# Patient Record
Sex: Female | Born: 1960 | Race: White | Hispanic: No | Smoking: Former smoker
Health system: Southern US, Community
[De-identification: ages and names within clinical notes are randomized; demographics above are authoritative.]

## PROBLEM LIST (undated history)

## (undated) DIAGNOSIS — M419 Scoliosis, unspecified: Secondary | ICD-10-CM

## (undated) DIAGNOSIS — M1990 Unspecified osteoarthritis, unspecified site: Secondary | ICD-10-CM

## (undated) DIAGNOSIS — I480 Paroxysmal atrial fibrillation: Secondary | ICD-10-CM

## (undated) DIAGNOSIS — Z9289 Personal history of other medical treatment: Secondary | ICD-10-CM

## (undated) DIAGNOSIS — M8580 Other specified disorders of bone density and structure, unspecified site: Secondary | ICD-10-CM

## (undated) DIAGNOSIS — I493 Ventricular premature depolarization: Secondary | ICD-10-CM

## (undated) DIAGNOSIS — I4719 Other supraventricular tachycardia: Secondary | ICD-10-CM

## (undated) DIAGNOSIS — I4819 Other persistent atrial fibrillation: Secondary | ICD-10-CM

## (undated) DIAGNOSIS — I491 Atrial premature depolarization: Secondary | ICD-10-CM

## (undated) DIAGNOSIS — R011 Cardiac murmur, unspecified: Secondary | ICD-10-CM

## (undated) DIAGNOSIS — I471 Supraventricular tachycardia: Secondary | ICD-10-CM

## (undated) HISTORY — DX: Other supraventricular tachycardia: I47.19

## (undated) HISTORY — DX: Atrial premature depolarization: I49.1

## (undated) HISTORY — DX: Unspecified osteoarthritis, unspecified site: M19.90

## (undated) HISTORY — DX: Personal history of other medical treatment: Z92.89

## (undated) HISTORY — PX: COLONOSCOPY: SHX174

## (undated) HISTORY — DX: Other specified disorders of bone density and structure, unspecified site: M85.80

## (undated) HISTORY — DX: Supraventricular tachycardia: I47.1

## (undated) HISTORY — DX: Paroxysmal atrial fibrillation: I48.0

## (undated) HISTORY — PX: TOE SURGERY: SHX1073

## (undated) HISTORY — DX: Scoliosis, unspecified: M41.9

## (undated) HISTORY — DX: Cardiac murmur, unspecified: R01.1

## (undated) HISTORY — DX: Ventricular premature depolarization: I49.3

## (undated) HISTORY — DX: Other persistent atrial fibrillation: I48.19

---

## 2009-03-04 ENCOUNTER — Ambulatory Visit: Payer: Self-pay | Admitting: Occupational Medicine

## 2009-03-04 ENCOUNTER — Encounter: Admission: RE | Admit: 2009-03-04 | Discharge: 2009-03-04 | Payer: Self-pay | Admitting: Family Medicine

## 2009-03-04 DIAGNOSIS — N200 Calculus of kidney: Secondary | ICD-10-CM

## 2015-02-04 ENCOUNTER — Ambulatory Visit: Payer: Self-pay | Admitting: Internal Medicine

## 2015-03-29 ENCOUNTER — Encounter: Payer: Self-pay | Admitting: *Deleted

## 2015-03-29 ENCOUNTER — Telehealth: Payer: Self-pay | Admitting: *Deleted

## 2015-03-29 NOTE — Telephone Encounter (Signed)
I SPOKE WITH NICOLE AT [facility]. SHE STATED THE PT HAS NEVER BEEN SEEN BY [facility]. SHE DIDN'T SEE ANY CARDIOLOGY NOTES IN THE PAPER CHART.

## 2015-03-29 NOTE — Telephone Encounter (Signed)
I LEFT A VM FOR PT TO CALL BACK WITH PCP INFO AND FAMILY HX .

## 2015-04-04 ENCOUNTER — Encounter: Payer: Self-pay | Admitting: Cardiology

## 2015-04-04 ENCOUNTER — Ambulatory Visit (INDEPENDENT_AMBULATORY_CARE_PROVIDER_SITE_OTHER): Payer: PRIVATE HEALTH INSURANCE | Admitting: Cardiology

## 2015-04-04 VITALS — BP 120/82 | HR 63 | Ht 70.0 in | Wt 180.6 lb

## 2015-04-04 DIAGNOSIS — R002 Palpitations: Secondary | ICD-10-CM | POA: Insufficient documentation

## 2015-04-04 DIAGNOSIS — R011 Cardiac murmur, unspecified: Secondary | ICD-10-CM

## 2015-04-04 DIAGNOSIS — I4510 Unspecified right bundle-branch block: Secondary | ICD-10-CM | POA: Insufficient documentation

## 2015-04-04 DIAGNOSIS — I450 Right fascicular block: Secondary | ICD-10-CM

## 2015-04-04 DIAGNOSIS — M79601 Pain in right arm: Secondary | ICD-10-CM | POA: Diagnosis not present

## 2015-04-04 NOTE — Patient Instructions (Signed)
Medication Instructions:  Your physician recommends that you continue on your current medications as directed. Please refer to the Current Medication list given to you today.   Labwork: None  Testing/Procedures: Your physician has requested that you have an echocardiogram. Echocardiography is a painless test that uses sound waves to create images of your heart. It provides your doctor with information about the size and shape of your heart and how well your heart's chambers and valves are working. This procedure takes approximately one hour. There are no restrictions for this procedure.   Your physician has requested that you have an exercise tolerance test. For further information please visit [email or web address] Please also follow instruction sheet, as given.  Follow-Up: You have been referred to Dr. Gilman Schmidt for hand/arm pain.  Your physician wants you to follow-up in: 1 year with Dr. Mayford Knife. You will receive a reminder letter in the mail two months in advance. If you don't receive a letter, please call our office to schedule the follow-up appointment.   Any Other Special Instructions Will Be Listed Below (If Applicable).

## 2015-04-04 NOTE — Progress Notes (Signed)
Cardiology Office Note   Date:  04/04/2015   ID:  Joann Murray, DOB Aug 15, 1961, MRN [id number]  PCP:  Marcy Panning, DO    Chief Complaint  Patient presents with  . New Evaluation    heart murmur      History of Present Illness: Joann Murray is a 54 y.o. female who presents for evaluation of heart murmur.  She recently went to an Urgent Care and was told that she had a heart murmur and needed to see a Cardiologist.  She apparently was seen by [facility] over 12 years ago bu Dr. Fraser Din and had a stress test that was normal, an echo showed MVP and PVC's.  She denies any chest pain, SOB, DOE, LE edema, dizziness,  or syncope.  She occasionally feels skipped heart beats when lying on her left side.  She has noticed that when she does Cardio her HR speed up very fast and then stays up for a while and feels weird.      Past Medical History  Diagnosis Date  . Murmur   . Osteopenia   . Osteoarthritis   . Scoliosis     Past Surgical History  Procedure Laterality Date  . Toe surgery       Current Outpatient Prescriptions  Medication Sig Dispense Refill  . cholecalciferol (VITAMIN D) 1000 UNITS tablet Take 1,000 Units by mouth daily.    . Multiple Vitamin (MULTIVITAMIN) capsule Take 1 capsule by mouth daily.     No current facility-administered medications for this visit.    Allergies:   Codeine    Social History:  The patient  reports that she has quit smoking. She does not have any smokeless tobacco history on file. She reports that she drinks alcohol. She reports that she does not use illicit drugs.   Family History:  The patient's family history includes Arthritis/Rheumatoid in her mother; Rheumatic fever in her mother; Stroke in her mother.    ROS:  Please see the history of present illness.   Otherwise, review of systems are positive for right forearm, elbow and hand pain.   All other systems are reviewed and negative.    PHYSICAL  EXAM: VS:  BP 120/82 mmHg  Pulse 63  Ht  (1.778 m)  Wt 180 lb 9.6 oz (81.92 kg)  BMI 25.91 kg/m2 , BMI Body mass index is 25.91 kg/(m^2). GEN: Well nourished, well developed, in no acute distress HEENT: normal Neck: no JVD, carotid bruits, or masses Cardiac: RRR; no murmurs, rubs, or gallops,no edema  Respiratory:  clear to auscultation bilaterally, normal work of breathing GI: soft, nontender, nondistended, + BS MS: no deformity or atrophy Skin: warm and dry, no rash Neuro:  Strength and sensation are intact Psych: euthymic mood, full affect   EKG:  EKG is ordered today. The ekg ordered today demonstrates NSR at 63bpm with IRBBB and septal infarct  and T wave inversions in V1 and V2   Recent Labs: No results found for requested labs within last 365 days.    Lipid Panel No results found for: CHOL, TRIG, HDL, CHOLHDL, VLDL, LDLCALC, LDLDIRECT    Wt Readings from Last 3 Encounters:  04/04/15 180 lb 9.6 oz (81.92 kg)  03/04/09 145 lb (65.772 kg)        ASSESSMENT AND PLAN:  1.  Heart murmur - she has a classic midsystolic click at the apex  c/w mitral valve prolapse.   I will check 2D echo to assess further 2.  Abnormal EKG with septal infarct - probable Lead placement - check 2D echo to assess LVF.  Check ETT . 3.  Palpitations - these occur in setting of aerobic exercise and then it takes a while for her heart rate to decrease to normal. - I will assess this at the time of her ETT. 4.  Right had and arm pain after an injury that has not improved.  I will refer her to Dr. Thomas Hoff with [facility].   Current medicines are reviewed at length with the patient today.  The patient does not have concerns regarding medicines.  The following changes have been made:  no change  Labs/ tests ordered today: See above Assessment and Plan No orders of the defined types were placed in this encounter.     Disposition:   FU with me  in 1 year  Signed, Quintella Reichert, MD    04/04/2015 9:39 AM    [facility] [address] [place], [place], [place]  [address] Phone: [phone]; Fax: [phone]

## 2015-06-12 ENCOUNTER — Other Ambulatory Visit: Payer: Self-pay | Admitting: Cardiology

## 2015-06-12 DIAGNOSIS — R002 Palpitations: Secondary | ICD-10-CM

## 2015-06-12 DIAGNOSIS — I4510 Unspecified right bundle-branch block: Secondary | ICD-10-CM

## 2015-06-12 DIAGNOSIS — R9431 Abnormal electrocardiogram [ECG] [EKG]: Secondary | ICD-10-CM

## 2015-06-13 ENCOUNTER — Ambulatory Visit (HOSPITAL_COMMUNITY): Payer: PRIVATE HEALTH INSURANCE | Attending: Cardiology

## 2015-06-13 ENCOUNTER — Other Ambulatory Visit: Payer: Self-pay

## 2015-06-13 ENCOUNTER — Other Ambulatory Visit: Payer: Self-pay | Admitting: Physician Assistant

## 2015-06-13 ENCOUNTER — Encounter: Payer: PRIVATE HEALTH INSURANCE | Admitting: Physician Assistant

## 2015-06-13 ENCOUNTER — Ambulatory Visit (INDEPENDENT_AMBULATORY_CARE_PROVIDER_SITE_OTHER): Payer: PRIVATE HEALTH INSURANCE

## 2015-06-13 DIAGNOSIS — R002 Palpitations: Secondary | ICD-10-CM | POA: Insufficient documentation

## 2015-06-13 DIAGNOSIS — I5189 Other ill-defined heart diseases: Secondary | ICD-10-CM | POA: Insufficient documentation

## 2015-06-13 DIAGNOSIS — Z87891 Personal history of nicotine dependence: Secondary | ICD-10-CM | POA: Insufficient documentation

## 2015-06-13 DIAGNOSIS — I4510 Unspecified right bundle-branch block: Secondary | ICD-10-CM

## 2015-06-13 DIAGNOSIS — R011 Cardiac murmur, unspecified: Secondary | ICD-10-CM | POA: Diagnosis not present

## 2015-06-13 DIAGNOSIS — R9431 Abnormal electrocardiogram [ECG] [EKG]: Secondary | ICD-10-CM

## 2015-06-13 DIAGNOSIS — R9439 Abnormal result of other cardiovascular function study: Secondary | ICD-10-CM

## 2015-06-13 DIAGNOSIS — I450 Right fascicular block: Secondary | ICD-10-CM | POA: Diagnosis not present

## 2015-06-13 LAB — EXERCISE TOLERANCE TEST
CHL CUP MPHR: 165 {beats}/min
CHL CUP RESTING HR STRESS: 68 {beats}/min
CHL RATE OF PERCEIVED EXERTION: 15
CSEPHR: 97 %
Estimated workload: 10.1 METS
Exercise duration (min): 9 min
Peak HR: 160 {beats}/min

## 2015-06-19 ENCOUNTER — Telehealth (HOSPITAL_COMMUNITY): Payer: Self-pay

## 2015-06-19 NOTE — Telephone Encounter (Signed)
Encounter complete. 

## 2015-06-26 ENCOUNTER — Encounter (HOSPITAL_COMMUNITY): Payer: Self-pay | Admitting: *Deleted

## 2015-06-26 ENCOUNTER — Ambulatory Visit (HOSPITAL_COMMUNITY)
Admission: RE | Admit: 2015-06-26 | Discharge: 2015-06-26 | Disposition: A | Discharge status: Home / Self Care | Payer: PRIVATE HEALTH INSURANCE | Source: Ambulatory Visit | Attending: Cardiovascular Disease | Admitting: Cardiovascular Disease

## 2015-06-26 DIAGNOSIS — R42 Dizziness and giddiness: Secondary | ICD-10-CM | POA: Insufficient documentation

## 2015-06-26 DIAGNOSIS — R0602 Shortness of breath: Secondary | ICD-10-CM | POA: Diagnosis not present

## 2015-06-26 DIAGNOSIS — R002 Palpitations: Secondary | ICD-10-CM | POA: Insufficient documentation

## 2015-06-26 DIAGNOSIS — R9439 Abnormal result of other cardiovascular function study: Secondary | ICD-10-CM

## 2015-06-26 DIAGNOSIS — R079 Chest pain, unspecified: Secondary | ICD-10-CM | POA: Diagnosis not present

## 2015-06-26 DIAGNOSIS — R9431 Abnormal electrocardiogram [ECG] [EKG]: Secondary | ICD-10-CM | POA: Diagnosis not present

## 2015-06-26 DIAGNOSIS — F17200 Nicotine dependence, unspecified, uncomplicated: Secondary | ICD-10-CM | POA: Insufficient documentation

## 2015-06-26 DIAGNOSIS — R0609 Other forms of dyspnea: Secondary | ICD-10-CM | POA: Diagnosis not present

## 2015-06-26 LAB — MYOCARDIAL PERFUSION IMAGING
CHL CUP NUCLEAR SRS: 3
CHL CUP NUCLEAR SSS: 8
CSEPED: 5 min
CSEPPHR: 206 {beats}/min
Estimated workload: 7 METS
Exercise duration (sec): 23 s
LV dias vol: 82 mL
LVSYSVOL: 32 mL
MPHR: 165 {beats}/min
Percent HR: 124 %
RPE: 14
Rest HR: 68 {beats}/min
SDS: 5
TID: 1.37

## 2015-06-26 MED ORDER — TECHNETIUM TC 99M SESTAMIBI GENERIC - CARDIOLITE
10.3000 | Freq: Once | INTRAVENOUS | Status: AC | PRN
  Administered 2015-06-26: 10 via INTRAVENOUS

## 2015-06-26 MED ORDER — TECHNETIUM TC 99M SESTAMIBI GENERIC - CARDIOLITE
31.6000 | Freq: Once | INTRAVENOUS | Status: AC | PRN
  Administered 2015-06-26: 32 via INTRAVENOUS

## 2015-06-26 NOTE — Progress Notes (Signed)
Dr Duke Salviaandolph reviewed Cardiolite study. Ok discharge pt home. Joann Murray, Joann Murray A

## 2015-06-27 ENCOUNTER — Telehealth: Payer: Self-pay | Admitting: *Deleted

## 2015-06-27 DIAGNOSIS — R9439 Abnormal result of other cardiovascular function study: Secondary | ICD-10-CM

## 2015-06-27 NOTE — Telephone Encounter (Signed)
Pt notified of myoview results and findings by phone. Pt advised per Dr. Mayford Knifeurner and Lorin PicketScott w. PA need to have Cardiac CT-A due to abnormal myoview. Pt aware [facility]t. Pt agreeable to plan of care.

## 2015-06-28 ENCOUNTER — Other Ambulatory Visit: Payer: Self-pay | Admitting: *Deleted

## 2015-06-28 ENCOUNTER — Encounter: Payer: Self-pay | Admitting: Physician Assistant

## 2015-06-28 ENCOUNTER — Telehealth: Payer: Self-pay | Admitting: Physician Assistant

## 2015-06-28 DIAGNOSIS — R9439 Abnormal result of other cardiovascular function study: Secondary | ICD-10-CM

## 2015-06-28 NOTE — Telephone Encounter (Signed)
Schedule for Cardiac CT on 07-04-15 @ 9am.  Schedule to have dental work done on Monday do she need to cancel since she is having the CT done.

## 2015-06-28 NOTE — Telephone Encounter (Signed)
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 She was concerned because Dr. Mayford Knifeurner has ordered for her to have a Cardiac CT-A due to abnormal myoview. I s/w Payton MccallumKaty K. RN who is Dr. Norris Crossurner's nurse who also agreed with me that since pt does not have any congential defect or valve replacement she does not need antibiotic. Pt verbalized understanding and said thank you for our help in this matter.

## 2015-07-01 ENCOUNTER — Telehealth: Payer: Self-pay | Admitting: Cardiology

## 2015-07-01 NOTE — Telephone Encounter (Signed)
New message      Pt is having a cornary CT this week.  She is concerned about all of the radiation and why is she having this test?  Please call

## 2015-07-01 NOTE — Telephone Encounter (Signed)
**Note De-identified Taggart Prasad Obfuscation** LMTCB

## 2015-07-02 NOTE — Telephone Encounter (Signed)
Per Dr. Delton SeeNelson, informed patient that a coronary CT has less radiation than nuclear stress tests. Patient concerned about cancer and her radiation exposure.  Reviewed myoview results with patient and that because it was abnormal, the only options left are to do a CTA or a cath, and a CT has less radiation than a cath. Instructed the patient to weigh her options and call back tomorrow if she decides to cancel test.  Patient agrees with treatment plan and is grateful for callback.

## 2015-07-02 NOTE — Telephone Encounter (Signed)
Follow up  ° ° ° °Patient returning call back to nurse from yesterday  °

## 2015-07-04 ENCOUNTER — Telehealth: Payer: Self-pay | Admitting: *Deleted

## 2015-07-04 ENCOUNTER — Encounter: Payer: Self-pay | Admitting: Physician Assistant

## 2015-07-04 ENCOUNTER — Ambulatory Visit (HOSPITAL_COMMUNITY)
Admission: RE | Admit: 2015-07-04 | Discharge: 2015-07-04 | Disposition: A | Discharge status: Home / Self Care | Payer: PRIVATE HEALTH INSURANCE | Source: Ambulatory Visit | Attending: Physician Assistant | Admitting: Physician Assistant

## 2015-07-04 DIAGNOSIS — R079 Chest pain, unspecified: Secondary | ICD-10-CM | POA: Diagnosis not present

## 2015-07-04 DIAGNOSIS — R9439 Abnormal result of other cardiovascular function study: Secondary | ICD-10-CM | POA: Insufficient documentation

## 2015-07-04 DIAGNOSIS — I4510 Unspecified right bundle-branch block: Secondary | ICD-10-CM

## 2015-07-04 DIAGNOSIS — R002 Palpitations: Secondary | ICD-10-CM

## 2015-07-04 DIAGNOSIS — R911 Solitary pulmonary nodule: Secondary | ICD-10-CM | POA: Insufficient documentation

## 2015-07-04 DIAGNOSIS — M4184 Other forms of scoliosis, thoracic region: Secondary | ICD-10-CM | POA: Diagnosis not present

## 2015-07-04 DIAGNOSIS — R931 Abnormal findings on diagnostic imaging of heart and coronary circulation: Secondary | ICD-10-CM | POA: Diagnosis not present

## 2015-07-04 MED ORDER — METOPROLOL TARTRATE 1 MG/ML IV SOLN
5.0000 mg | INTRAVENOUS | Status: DC | PRN
  Administered 2015-07-04 (×2): 5 mg via INTRAVENOUS

## 2015-07-04 MED ORDER — METOPROLOL TARTRATE 1 MG/ML IV SOLN
INTRAVENOUS | Status: AC
  Administered 2015-07-04: 5 mg via INTRAVENOUS
  Filled 2015-07-04: qty 5

## 2015-07-04 MED ORDER — IOHEXOL 350 MG/ML SOLN
80.0000 mL | Freq: Once | INTRAVENOUS | Status: AC | PRN
  Administered 2015-07-04: 100 mL via INTRAVENOUS

## 2015-07-04 MED ORDER — NITROGLYCERIN 0.4 MG SL SUBL
0.4000 mg | SUBLINGUAL_TABLET | SUBLINGUAL | Status: DC | PRN
  Administered 2015-07-04: 0.4 mg via SUBLINGUAL

## 2015-07-04 MED ORDER — NITROGLYCERIN 0.4 MG SL SUBL
SUBLINGUAL_TABLET | SUBLINGUAL | Status: AC
  Filled 2015-07-04: qty 1

## 2015-07-04 NOTE — Telephone Encounter (Signed)
Pt notified of Cardiac Ct results by phone with verbal understanding. pt agreeable to event monitor with f/u w/Dr. Mayford Knifeurner 6-8 weeks. [facility] aware [facility]/u and monitor

## 2015-07-08 ENCOUNTER — Ambulatory Visit (INDEPENDENT_AMBULATORY_CARE_PROVIDER_SITE_OTHER): Payer: PRIVATE HEALTH INSURANCE

## 2015-07-08 DIAGNOSIS — R002 Palpitations: Secondary | ICD-10-CM

## 2015-07-08 DIAGNOSIS — I450 Right fascicular block: Secondary | ICD-10-CM | POA: Diagnosis not present

## 2015-07-29 ENCOUNTER — Telehealth: Payer: Self-pay | Admitting: Cardiology

## 2015-07-29 NOTE — Telephone Encounter (Signed)
New problem     Want to verify pt's insurance information per referral that they received.

## 2015-07-29 NOTE — Telephone Encounter (Signed)
To Medical Records.

## 2015-08-28 ENCOUNTER — Telehealth: Payer: Self-pay

## 2015-08-28 DIAGNOSIS — R002 Palpitations: Secondary | ICD-10-CM

## 2015-08-28 NOTE — Telephone Encounter (Signed)
Informed patient of results and verbal understanding expressed.   48 hour holter ordered for scheduling. Patient understands to workout while wearing the monitor.

## 2015-08-28 NOTE — Telephone Encounter (Signed)
-----   Message from Quintella Reichertraci R Turner, MD sent at 08/27/2015  9:50 AM EST ----- We can get a 48 hour HOlter and have her do her workout while wearing it.

## 2015-09-04 ENCOUNTER — Ambulatory Visit (INDEPENDENT_AMBULATORY_CARE_PROVIDER_SITE_OTHER): Payer: Managed Care, Other (non HMO)

## 2015-09-04 DIAGNOSIS — R002 Palpitations: Secondary | ICD-10-CM | POA: Diagnosis not present

## 2015-09-11 ENCOUNTER — Telehealth: Payer: Self-pay

## 2015-09-11 NOTE — Telephone Encounter (Addendum)
Joann Murray from [employer] called to report 48 hour holter monitor results.  She reports an episode of multifocal atrial tachycardia at 119-208 BPM that could not be ruled out as atrial fibrillation.  She st she emailed the strips to the office.  Message sent to monitor techs to retrieve strips.

## 2015-09-12 NOTE — Telephone Encounter (Signed)
Patient st she did NOT have symptoms while wearing the monitor like she had been complaining about. She did say that 2 hours after the monitor was placed, her daughter went into labor so she was stressed. She also st she worked out twice while the monitor was on.  She st everything was normal and no symptoms occurred.

## 2015-09-14 NOTE — Telephone Encounter (Signed)
Please get strips for my review

## 2015-09-17 NOTE — Telephone Encounter (Signed)
Per monitor tech, strips sent to Dr. Norris Crossurner's in-basket.

## 2015-09-18 ENCOUNTER — Telehealth: Payer: Self-pay | Admitting: Cardiology

## 2015-09-18 NOTE — Telephone Encounter (Signed)
New Message  Pt stated that she thought her f/u sched for tomorrow 09/19/15 was to be cancelled per Dr Norris Crossurner's RN. Pt wanted to make sure. Please call back and discuss.

## 2015-09-18 NOTE — Telephone Encounter (Signed)
Returned patient's call - she had already cancelled appointment. Informed patient that per Dr. Mayford Knifeurner, any further instructions will be replayed on the phone and visit is not necessary at this time. Patient grateful for callback.

## 2015-09-19 ENCOUNTER — Ambulatory Visit: Payer: PRIVATE HEALTH INSURANCE | Admitting: Cardiology

## 2015-09-24 ENCOUNTER — Telehealth: Payer: Self-pay | Admitting: *Deleted

## 2015-09-24 DIAGNOSIS — I480 Paroxysmal atrial fibrillation: Secondary | ICD-10-CM

## 2015-09-24 MED ORDER — METOPROLOL SUCCINATE ER 25 MG PO TB24: 25.0000 mg | ORAL_TABLET | Freq: Every day | ORAL | Status: DC

## 2015-09-24 NOTE — Telephone Encounter (Signed)
Please let patient know that heart monitor showed NSR with average heart rate 79bpm. SHe did have some short bursts of paroxysmal atrial fibrillation. Her CHADS2VASC score is 1 (female). Please have her start Toprol XL  daily. Followup with PA in 4 week. Please set her up for a 30 day event monitor to assess afib load

## 2015-09-25 ENCOUNTER — Other Ambulatory Visit (INDEPENDENT_AMBULATORY_CARE_PROVIDER_SITE_OTHER): Payer: PRIVATE HEALTH INSURANCE | Admitting: *Deleted

## 2015-09-25 DIAGNOSIS — I480 Paroxysmal atrial fibrillation: Secondary | ICD-10-CM | POA: Diagnosis not present

## 2015-09-26 LAB — TSH: TSH: 1.505 u[IU]/mL (ref 0.350–4.500)

## 2015-09-27 ENCOUNTER — Telehealth: Payer: Self-pay | Admitting: *Deleted

## 2015-09-27 NOTE — Telephone Encounter (Signed)
Pt notified of lab results by phone with verbal understanding.  

## 2015-10-10 ENCOUNTER — Ambulatory Visit (INDEPENDENT_AMBULATORY_CARE_PROVIDER_SITE_OTHER): Payer: Managed Care, Other (non HMO)

## 2015-10-10 DIAGNOSIS — I480 Paroxysmal atrial fibrillation: Secondary | ICD-10-CM

## 2015-11-12 NOTE — Progress Notes (Signed)
Cardiology Office Note:    Date:  11/13/2015   ID:  Joann Murray, DOB Dec 07, 1960, MRN [id number]  PCP:  No PCP Per Patient  Cardiologist:  Dr. Armanda Magic   Electrophysiologist:  n/a  Chief Complaint  Patient presents with  . Atrial Fibrillation    Follow up - Wearing another monitor     History of Present Illness:     Joann Murray is a 55 y.o. female was evaluated by Dr. Mayford Knife in 7/16 for a heart murmur. She has a remote history of mitral valve prolapse on echocardiogram. She complained of occasional skipped heartbeats. She noted elevated heart rates during exercise that were slow to recover. Echocardiogram, exercise treadmill test were arranged.   Echocardiogram in 9/16 demonstrated vigorous LV function with EF 65-70%, normal wall motion and moderate diastolic dysfunction. GLS -18.6%.     ETT on 06/13/15 demonstrated a hypertensive blood pressure response and inferolateral ST depression.   Nuclear stress test 10/16 was intermediate risk with anterolateral ischemia, EF 61%. Patient had SVT with heart rates up to 200.   Cardiac CTA was arranged and demonstrated a calcium score of 0 and normal coronary arteries.   Event monitor demonstrated no significant arrhythmias. Due to ongoing symptoms, the patient was set up for a 48-hour Holter in December 2016. This demonstrated normal sinus rhythm with average heart rate of 79. She did have short bursts of paroxysmal atrial fibrillation. Toprol-XL 25 mg daily was added to her medical regimen.   Follow-up thirty-day event monitor was arranged to assess for atrial fibrillation burden.  Preliminary monitor results are available for me to review today. This demonstrates sinus rhythm, sinus tachycardia, PVCs and PACs. There are a few atrial runs better brief (4-5 beats). These appear to be atrial tachycardia versus atrial fibrillation.  CHADS2-VASc=1 (female).   She returns for FU.  Here today with her new granddaughter.  The patient is doing  well. She has had less palpitations since starting on Toprol-XL.  She denies chest pain or dyspnea. No syncope, orthopnea, PND, edema.  She has noted higher BPs over the past few mos.     Past Medical History  Diagnosis Date  . Murmur   . Osteopenia   . Osteoarthritis   . Scoliosis   . History of CT scan of chest     Cardiac CTA 10/16:  Normal coronary arteries, Ca score 0    Past Surgical History  Procedure Laterality Date  . Toe surgery      Current Medications: Outpatient Prescriptions Prior to Visit  Medication Sig Dispense Refill  . cholecalciferol (VITAMIN D) 1000 UNITS tablet Take 1,000 Units by mouth daily.    . metoprolol succinate (TOPROL XL) 25 MG 24 hr tablet Take 1 tablet (25 mg total) by mouth daily. 30 tablet 1  . Multiple Vitamin (MULTIVITAMIN) capsule Take 1 capsule by mouth daily.     No facility-administered medications prior to visit.     Allergies:   Codeine   Social History   Social History  . Marital Status: Married    Spouse Name: fredrick  . Number of Children: 3  . Years of Education: college   Occupational History  . volunteer    Social History Main Topics  . Smoking status: Former Games developer  . Smokeless tobacco: None  . Alcohol Use: 0.0 oz/week    0 Standard drinks or equivalent per week  . Drug Use: No  . Sexual Activity: Not Asked   Other Topics Concern  .  None   Social History Narrative     Family History:  The patient's family history includes Arthritis/Rheumatoid in her mother; Rheumatic fever in her mother; Stroke in her mother.   ROS:   Please see the history of present illness.    Review of Systems  Cardiovascular: Positive for irregular heartbeat.  All other systems reviewed and are negative.   Physical Exam:    VS:  BP 152/72 mmHg  Pulse 70  Ht 5\' 10"  (1.778 m)  Wt 150 lb 1.9 oz (68.094 kg)  BMI 21.54 kg/m2   GEN: Well nourished, well developed, in no acute distress HEENT: normal Neck: no JVD, no  masses Cardiac: Normal S1/S2, RRR; no murmurs, no edema   Respiratory:  clear to auscultation bilaterally; no wheezing, rhonchi or rales GI: soft, nontender  MS: no deformity or atrophy Skin: warm and dry  Neuro:  no focal deficits  Psych: Alert and oriented x 3, normal affect  Wt Readings from Last 3 Encounters:  11/13/15 150 lb 1.9 oz (68.094 kg)  06/26/15 180 lb (81.647 kg)  04/04/15 180 lb 9.6 oz (81.92 kg)      Studies/Labs Reviewed:     EKG:  EKG is  ordered today.  The ekg ordered today demonstrates NSR, HR 68, normal axis, NSSTTW changes, QTc 421 ms  Recent Labs: 09/25/2015: TSH 1.505   Recent Lipid Panel No results found for: CHOL, TRIG, HDL, CHOLHDL, VLDL, LDLCALC, LDLDIRECT  Additional studies/ records that were reviewed today include:   Holter 12/16  Normal Sinus Rhythm. The avearage heart rate was 79bpm. The heart rate ranged from 48 to 149 bpm.  Frequent PACs and atrial couplets.  Paroxysmal A. fibrillation vs. nonsustained atrial tachy. Most likely PAF given irregularity with no discernable P waves.  Occasional PVCs and ventricular couplets.  Event Monitor 10/16  Normal sinus rhythm with average heart rate 70 bpm.  Rare PAC  Cardiac CTA 10/16 IMPRESSION: 1) Normal right dominant coronary arteries distal circumflex somewhat poorly visualized 2) Calcium Score 0  Myoview 10/16 Myocardial perfusion is normal. The study is normal. Findings consistent with ischemia. This is an intermediate risk study. Overall left ventricular systolic function was normal. LV cavity size is normal. Nuclear stress EF: 61%. The left ventricular ejection fraction is normal (55-65%). There is no prior study for comparison.  Echo 9/16 Vigorous LVF, EF 65-70%, no RWMA, Gr 2 DD, GLS - 18.6%   ASSESSMENT:     1. PAF (paroxysmal atrial fibrillation) (HCC)   2. Elevated blood pressure     PLAN:     In order of problems listed above:  1. PAF - She has atrial arrhythmias  including ATach and PAF.  CHADS2-VASc=1 in the past.  She notes her BPs have been higher over the past few mos.  I have asked her to monitor this.  If her BP is consistently > 140/90, her CHADS2-VASc will be 2 and we will have to consider anticoagulation.  She has had less palpitations on Toprol-XL. Will continue this for now.  If she has a resurgence of symptoms, we could consider adding Flecainide 50 mg bid (neg Coronary CTA).  Plan 6 mos FU or sooner if needed.   2. Elevated BP - Monitor BP x 2 weeks.  If > 140/90, will need to adjust medications.  Consider adding ACE inhibitor.        Medication Adjustments/Labs and Tests Ordered: Current medicines are reviewed at length with the patient today.  Concerns regarding medicines  are outlined above.  Medication changes, Labs and Tests ordered today are outlined in the Patient Instructions noted below. Patient Instructions  Medication Instructions:  Your physician recommends that you continue on your current medications as directed. Please refer to the Current Medication list given to you today.  Labwork: NONE  Testing/Procedures: NONE  Follow-Up: Your physician wants you to follow-up in: 6 MONTHS WITH DR. Sherlyn Lick will receive a reminder letter in the mail two months in advance. If you don't receive a letter, please call our office to schedule the follow-up appointment.  Any Other Special Instructions Will Be Listed Below (If Applicable). CHECK BP DAILY FOR 2 WEEKS AND SEND READINGS TO Cloyce Blankenhorn, PAC   If you need a refill on your cardiac medications before your next appointment, please call your pharmacy.   Signed, Tereso Newcomer, PA-C  11/13/2015 2:39 PM    [facility] [address] [place], [place], [place]  [address] Phone: [phone]; Fax: [phone]

## 2015-11-13 ENCOUNTER — Encounter: Payer: Self-pay | Admitting: Physician Assistant

## 2015-11-13 ENCOUNTER — Ambulatory Visit (INDEPENDENT_AMBULATORY_CARE_PROVIDER_SITE_OTHER): Payer: Managed Care, Other (non HMO) | Admitting: Physician Assistant

## 2015-11-13 VITALS — BP 152/72 | HR 70 | Ht 70.0 in | Wt 150.1 lb

## 2015-11-13 DIAGNOSIS — IMO0001 Reserved for inherently not codable concepts without codable children: Secondary | ICD-10-CM

## 2015-11-13 DIAGNOSIS — R030 Elevated blood-pressure reading, without diagnosis of hypertension: Secondary | ICD-10-CM | POA: Diagnosis not present

## 2015-11-13 DIAGNOSIS — I480 Paroxysmal atrial fibrillation: Secondary | ICD-10-CM

## 2015-11-13 NOTE — Patient Instructions (Addendum)
Medication Instructions:  Your physician recommends that you continue on your current medications as directed. Please refer to the Current Medication list given to you today.  Labwork: NONE  Testing/Procedures: NONE  Follow-Up: Your physician wants you to follow-up in: 6 MONTHS WITH DR. Sherlyn Lick will receive a reminder letter in the mail two months in advance. If you don't receive a letter, please call our office to schedule the follow-up appointment.  Any Other Special Instructions Will Be Listed Below (If Applicable). CHECK BP DAILY FOR 2 WEEKS AND SEND READINGS TO SCOTT WEAVER, PAC   If you need a refill on your cardiac medications before your next appointment, please call your pharmacy.

## 2015-11-18 ENCOUNTER — Telehealth: Payer: Self-pay | Admitting: Cardiology

## 2015-11-18 NOTE — Telephone Encounter (Signed)
[facility]  She had coronary CTA 06/2015 that was normal.  No evidence of CAD.  Patient has had some palpitations with ? PAF but CHADs2VASC score is 1.  Stable and low risk from cardiac standpoint for Surgery.

## 2015-11-19 ENCOUNTER — Other Ambulatory Visit: Payer: Self-pay | Admitting: Cardiology

## 2015-11-19 NOTE — Telephone Encounter (Signed)
Printed and faxed to [facility]. Fax: [phone]

## 2015-12-10 ENCOUNTER — Telehealth: Payer: Self-pay | Admitting: Cardiology

## 2015-12-10 NOTE — Telephone Encounter (Signed)
Informed patient clearance was faxed 3/7, but will be faxed again. Confirmed fax number - [phone]. Instructed patient to call if clearance is still not received. Patient was grateful for call.

## 2015-12-10 NOTE — Telephone Encounter (Signed)
New Message:     A medical clearance was faxed about 3 weeks ago,she said she never received it back.

## 2015-12-18 ENCOUNTER — Telehealth: Payer: Self-pay

## 2015-12-18 NOTE — Telephone Encounter (Signed)
Received note from Medical Records that patient cancelled her surgery. Called patient, who states [facility] will not do her procedure despite the fact Dr. Mayford Knifeurner has given clearance because he was unsure of her EKG. Reiterated to patient that she was cleared by Cardiology and apologized to her that her doctor does not want to do the procedure regardless. Apologized to patient and told her to call back if she has any further questions or concerns.

## 2016-01-20 ENCOUNTER — Telehealth: Payer: Self-pay | Admitting: Cardiology

## 2016-01-20 NOTE — Telephone Encounter (Signed)
3/6 phone encounter and EKGs printed to be faxed to Dr. Hollace HaywardKortesis.

## 2016-01-20 NOTE — Telephone Encounter (Signed)
Follow up   Pt wants RN to call and/or fax over surgical clearance to new doctor, she verbalized that it was already approved by Dr.Turner  Dr.Kortesis Office number  [phone] Fax [phone]  EKG need to be reviewed and signed by provider with medica clearance faxed to the office

## 2016-05-25 ENCOUNTER — Other Ambulatory Visit: Payer: Self-pay

## 2016-05-25 MED ORDER — METOPROLOL SUCCINATE ER 25 MG PO TB24: ORAL_TABLET | ORAL | 3 refills | Status: DC

## 2016-05-25 NOTE — Telephone Encounter (Signed)
metoprolol succinate (TOPROL-XL) 25 MG 24 hr tablet  Medication  Date: 05/25/2016 Department[facility] [address] Office Ordering/Authorizing: Quintella Reichertraci R Turner, MD  Order Providers   Prescribing Provider Encounter Provider  Quintella Reichertraci R Turner, MD Rebbeca Pauletavia Zach Tietje, CMA  Medication Detail    Disp Refills Start End   metoprolol succinate (TOPROL-XL) 25 MG 24 hr tablet 90 tablet 3 05/25/2016    Sig: TAKE 1 TABLET (25 MG TOTAL) BY MOUTH DAILY.   E-Prescribing Status: Receipt confirmed by pharmacy (05/25/2016 8:59 AM EDT)   Pharmacy   [facility] [phone] - [place], [place] - [address]   RX already sent

## 2016-10-14 ENCOUNTER — Ambulatory Visit: Payer: PRIVATE HEALTH INSURANCE | Admitting: Cardiology

## 2016-10-30 ENCOUNTER — Encounter: Payer: Self-pay | Admitting: *Deleted

## 2016-11-11 ENCOUNTER — Ambulatory Visit: Payer: PRIVATE HEALTH INSURANCE | Admitting: Cardiology

## 2016-12-08 ENCOUNTER — Encounter (INDEPENDENT_AMBULATORY_CARE_PROVIDER_SITE_OTHER): Payer: Self-pay

## 2016-12-08 ENCOUNTER — Encounter: Payer: Self-pay | Admitting: Cardiology

## 2016-12-08 ENCOUNTER — Ambulatory Visit (INDEPENDENT_AMBULATORY_CARE_PROVIDER_SITE_OTHER): Payer: PRIVATE HEALTH INSURANCE | Admitting: Cardiology

## 2016-12-08 DIAGNOSIS — I481 Persistent atrial fibrillation: Secondary | ICD-10-CM | POA: Diagnosis not present

## 2016-12-08 DIAGNOSIS — I4819 Other persistent atrial fibrillation: Secondary | ICD-10-CM

## 2016-12-08 MED ORDER — METOPROLOL SUCCINATE ER 25 MG PO TB24: ORAL_TABLET | ORAL | 3 refills | Status: DC

## 2016-12-08 NOTE — Progress Notes (Signed)
Cardiology Office Note    Date:  12/08/2016   ID:  Joann Murray, DOB 03/13/1961, MRN [id number]  PCP:  No PCP Per Patient  Cardiologist:  Armanda Magicraci Wyat Infinger, MD   Chief Complaint  Patient presents with  . Atrial Fibrillation    History of Present Illness:  Joann Murray is a 56 y.o. female who presents for followup of heart murmur.  She has a remote history of  MVP and PVC's.  Her most recent echo in 2016 showed normal LVF with increased stiffness of heart muscle.  She also has a history of nonsustained atrial tachycardia and PAF and has a CHADS2VASC score of 1.  She is doing well today.   She denies any chest pain, SOB, DOE, LE edema, dizziness,  or syncope.  She occasionally feels skipped heart beats but nothing like what she had before.    Past Medical History:  Diagnosis Date  . History of CT scan of chest    Cardiac CTA 10/16:  Normal coronary arteries, Ca score 0  . Murmur    normal LVF with G2DD on echo 2016  . Osteoarthritis   . Osteopenia   . Persistent atrial fibrillation (HCC)    CHADS2VASC score of 1  . Scoliosis     Past Surgical History:  Procedure Laterality Date  . TOE SURGERY      Current Medications: Current Meds  Medication Sig  . cholecalciferol (VITAMIN D) 1000 UNITS tablet Take 1,000 Units by mouth daily.  . Fish Oil-Coenzyme Q10 (FISH OIL PLUS CO Q-10 PO) Take 1 capsule by mouth daily. Take one capsule by mouth once daily Patient not sure of dose  . metoprolol succinate (TOPROL-XL) 25 MG 24 hr tablet TAKE 1 TABLET (25 MG TOTAL) BY MOUTH DAILY.  . Multiple Vitamin (MULTIVITAMIN) capsule Take 1 capsule by mouth daily.    Allergies:   Codeine   Social History   Social History  . Marital status: Married    Spouse name: fredrick  . Number of children: 3  . Years of education: college   Occupational History  . volunteer    Social History Main Topics  . Smoking status: Former Games developermoker  . Smokeless tobacco: Never Used  . Alcohol use 0.0 oz/week    . Drug use: No  . Sexual activity: Not Asked   Other Topics Concern  . None   Social History Narrative  . None     Family History:  The patient's family history includes Arthritis/Rheumatoid in her mother; Rheumatic fever in her mother; Stroke in her mother.   ROS:   Please see the history of present illness.    ROS All other systems reviewed and are negative.  No flowsheet data found.     PHYSICAL EXAM:   VS:  BP 118/70   Pulse 62   Ht 5\' 10"  (1.778 m)   Wt 150 lb 12 oz (68.4 kg)   SpO2 97%   BMI 21.63 kg/m    GEN: Well nourished, well developed, in no acute distress  HEENT: normal  Neck: no JVD, carotid bruits, or masses Cardiac: RRR; no murmurs, rubs, or gallops,no edema.  Intact distal pulses bilaterally.  Respiratory:  clear to auscultation bilaterally, normal work of breathing GI: soft, nontender, nondistended, + BS MS: no deformity or atrophy  Skin: warm and dry, no rash Neuro:  Alert and Oriented x 3, Strength and sensation are intact Psych: euthymic mood, full affect  Wt Readings from Last 3 Encounters:  12/08/16  150 lb 12 oz (68.4 kg)  11/13/15 150 lb 1.9 oz (68.1 kg)  06/26/15 180 lb (81.6 kg)      Studies/Labs Reviewed:   EKG:  EKG is ordered today.  The ekg ordered today demonstrates NSR at 62bpm with no ST changes  Recent Labs: No results found for requested labs within last 8760 hours.   Lipid Panel No results found for: CHOL, TRIG, HDL, CHOLHDL, VLDL, LDLCALC, LDLDIRECT  Additional studies/ records that were reviewed today include:  none    ASSESSMENT:    1. Persistent atrial fibrillation (HCC)      PLAN:  In order of problems listed above:  1. Persistent atrial fibrillation and nonsustained atrial tachycardia - she has not had any reoccurrence in the past year.  Occasionally she will have a few extra heart beats.  She will continue on BB therapy.      Medication Adjustments/Labs and Tests Ordered: Current medicines are  reviewed at length with the patient today.  Concerns regarding medicines are outlined above.  Medication changes, Labs and Tests ordered today are listed in the Patient Instructions below.  There are no Patient Instructions on file for this visit.   Signed, Armanda Magic, MD  12/08/2016 10:43 AM    [facility] [address] [place], [place], [place]  [address] Phone: [phone]; Fax: [phone]

## 2016-12-08 NOTE — Patient Instructions (Signed)

## 2017-06-18 ENCOUNTER — Telehealth: Payer: Self-pay | Admitting: *Deleted

## 2017-06-18 NOTE — Telephone Encounter (Signed)
Follow Up:    Vernona Rieger called and said she does not have a fax machine.She said if you want to give the pt the letter and she can bring it to her please.

## 2017-06-18 NOTE — Telephone Encounter (Signed)
Left vm with [facility]/Laura Aundria Rud to fax over surgical clearance paperwork to [phone].  Need fax number.  Looks like left mutiple messages.  Paperwork back in Dr.Turner's folders.

## 2017-10-07 IMAGING — NM NM MISC PROCEDURE
6 series · 36 of 36 positions shown · non-contrast
Comparison: none

[Series 1: wbr rest · 6.40mm/px · 6 of 64 frames shown]
[frame 6/64]
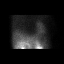
[frame 16/64]
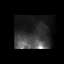
[frame 27/64]
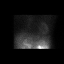
[frame 38/64]
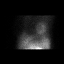
[frame 48/64]
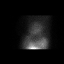
[frame 59/64]
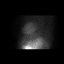

[Series 1: wbr_r-proj_st wbr rest · 6.40mm/px · 6 of 64 frames shown]
[frame 6/64]
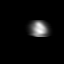
[frame 16/64]
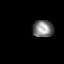
[frame 27/64]
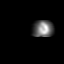
[frame 38/64]
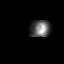
[frame 48/64]
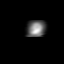
[frame 59/64]
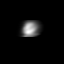

[Series 2: wbr stress-gsp · 6.40mm/px · 6 of 508 frames shown]
[frame 43/508  full-range]
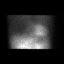
[frame 127/508  full-range]
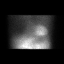
[frame 212/508  full-range]
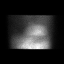
[frame 297/508  full-range]
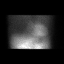
[frame 381/508  full-range]
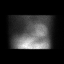
[frame 466/508  full-range]
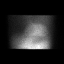

[Series 2: wbr_s-proj_st wbr stress-gsp · 6.40mm/px · 6 of 512 frames shown]
[frame 43/512]
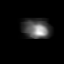
[frame 128/512]
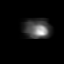
[frame 214/512]
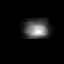
[frame 299/512]
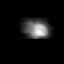
[frame 384/512]
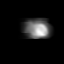
[frame 470/512]
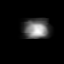

[Series 3: wbr stress-sum-em · 6.40mm/px · 6 of 64 frames shown]
[frame 6/64]
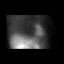
[frame 16/64]
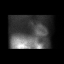
[frame 27/64]
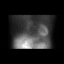
[frame 38/64]
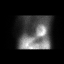
[frame 48/64]
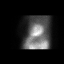
[frame 59/64]
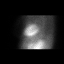

[Series 3: wbr_s-proj_st wbr stress-sum-em · 6.40mm/px · 6 of 64 frames shown]
[frame 6/64]
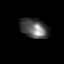
[frame 16/64]
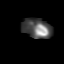
[frame 27/64]
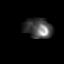
[frame 38/64]
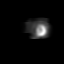
[frame 48/64]
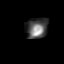
[frame 59/64]
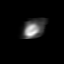

[36 of 36 positions shown; findings below may reference images not displayed]

Canned report from images found in remote index.

Refer to host system for actual result text.

## 2017-11-03 ENCOUNTER — Telehealth: Payer: Self-pay

## 2017-11-03 ENCOUNTER — Encounter: Payer: Self-pay | Admitting: Cardiology

## 2017-11-03 NOTE — Telephone Encounter (Signed)
Returned call to Dr Solmon IceLaura Murray, she does not have a fax so please mail clearance to her at: [address] [place] [phone]

## 2017-11-03 NOTE — Telephone Encounter (Signed)
Called pt she states that it is ok to mail to [facility] as requested. Mailed

## 2017-11-03 NOTE — Telephone Encounter (Signed)
   [facility] Pre-operative Risk Assessment    Request for surgical clearance:  1. What type of surgery is being performed? micocurrent series  2. When is this surgery scheduled? TBD  3. What type of clearance is required (medical clearance vs. Pharmacy clearance to hold med vs. Both)? Medical   4. Are there any medications that need to be held prior to surgery and how long? None listed   5. Practice name and name of physician performing surgery?  1. [facility] 2. Dr. Mickel Baas Roders  6. What is your office phone and fax number?  1. Phone: [phone]  7. Anesthesia type (None, local, MAC, general) ? None specified     _________________________________________________________________   (provider comments below)

## 2017-11-03 NOTE — Telephone Encounter (Signed)
   Primary Cardiologist: No primary care provider on file.  Chart reviewed as part of pre-operative protocol coverage. Patient was contacted 11/03/2017 in reference to pre-operative risk assessment for pending surgery as outlined below.  Larence PenningRebecca Folk was last seen on 12/08/2016 by Dr.Turner..  Since that day, Larence PenningRebecca Hansson has done very well.  He offers no cardiac complaints.  She discussed this procedure with Dr. Mayford Knifeurner, and was OK  to proceed.   Therefore, based on ACC/AHA guidelines, the patient would be at acceptable risk for the planned procedure without further cardiovascular testing.   I will route this recommendation to the requesting party via Epic fax function and remove from pre-op pool.  Please call with questions. Joni ReiningKathryn Melanni Benway DNP, ANP, AACC   11/03/2017, 2:31 PM

## 2017-11-05 ENCOUNTER — Telehealth: Payer: Self-pay | Admitting: Cardiology

## 2017-11-05 NOTE — Telephone Encounter (Signed)
[facility] paper signed by Dr.Turner mailed to  [employer],[place] [phone]

## 2017-12-18 ENCOUNTER — Other Ambulatory Visit: Payer: Self-pay | Admitting: Cardiology

## 2017-12-27 ENCOUNTER — Encounter: Payer: Self-pay | Admitting: Cardiology

## 2017-12-27 ENCOUNTER — Ambulatory Visit (INDEPENDENT_AMBULATORY_CARE_PROVIDER_SITE_OTHER): Payer: Commercial Managed Care - PPO | Admitting: Cardiology

## 2017-12-27 VITALS — BP 108/70 | HR 65 | Ht 70.0 in | Wt 146.0 lb

## 2017-12-27 DIAGNOSIS — I480 Paroxysmal atrial fibrillation: Secondary | ICD-10-CM | POA: Diagnosis not present

## 2017-12-27 MED ORDER — METOPROLOL SUCCINATE ER 25 MG PO TB24: ORAL_TABLET | ORAL | 3 refills | Status: DC

## 2017-12-27 NOTE — Addendum Note (Signed)
Addended by: Phineas SemenOBERTSON, Amonie Wisser on: 12/27/2017 09:54 AM   Modules accepted: Orders

## 2017-12-27 NOTE — Progress Notes (Signed)
Cardiology Office Note:    Date:  12/27/2017   ID:  Larence Penning, DOB 09-08-1961, MRN [id number]  PCP:  Patient, No Pcp Per  Cardiologist:  Armanda Magic, MD    Referring MD: No ref. provider found   Chief Complaint  Patient presents with  . Atrial Fibrillation    History of Present Illness:    Joann Murray is a 57 y.o. female with a hx of mitral valve prolapse with PVCs and diastolic dysfunction on 2D echocardiogram in 2016.  She also has a history of nonsustained atrial tachycardia and paroxysmal atrial fibrillation with a CHADSVASC score of 1.  She is here today for followup and is doing well.  She denies any chest pain or pressure, SOB, DOE, PND, orthopnea, LE edema, dizziness, palpitations or syncope. She is compliant with her meds and is tolerating meds with no SE.    Past Medical History:  Diagnosis Date  . History of CT scan of chest    Cardiac CTA 10/16:  Normal coronary arteries, Ca score 0  . Murmur    normal LVF with G2DD on echo 2016  . Osteoarthritis   . Osteopenia   . Persistent atrial fibrillation (HCC)    CHADS2VASC score of 1  . Scoliosis     Past Surgical History:  Procedure Laterality Date  . TOE SURGERY      Current Medications: Current Meds  Medication Sig  . cholecalciferol (VITAMIN D) 1000 UNITS tablet Take 1,000 Units by mouth daily.  . Fish Oil-Coenzyme Q10 (FISH OIL PLUS CO Q-10 PO) Take 1 capsule by mouth daily. Take one capsule by mouth once daily Patient not sure of dose  . metoprolol succinate (TOPROL-XL) 25 MG 24 hr tablet TAKE 1 TABLET (25 MG TOTAL) BY MOUTH DAILY. Please keep upcoming appt for future refills. Thank you  . Multiple Vitamin (MULTIVITAMIN) capsule Take 1 capsule by mouth daily.     Allergies:   Codeine   Social History   Socioeconomic History  . Marital status: Married    Spouse name: fredrick  . Number of children: 3  . Years of education: college  . Highest education level: Not on file  Occupational History  .  Occupation: Agricultural consultant  Social Needs  . Financial resource strain: Not on file  . Food insecurity:    Worry: Not on file    Inability: Not on file  . Transportation needs:    Medical: Not on file    Non-medical: Not on file  Tobacco Use  . Smoking status: Former Games developer  . Smokeless tobacco: Never Used  Substance and Sexual Activity  . Alcohol use: Yes    Alcohol/week: 0.0 oz  . Drug use: No  . Sexual activity: Not on file  Lifestyle  . Physical activity:    Days per week: Not on file    Minutes per session: Not on file  . Stress: Not on file  Relationships  . Social connections:    Talks on phone: Not on file    Gets together: Not on file    Attends religious service: Not on file    Active member of club or organization: Not on file    Attends meetings of clubs or organizations: Not on file    Relationship status: Not on file  Other Topics Concern  . Not on file  Social History Narrative  . Not on file     Family History: The patient's family history includes Arthritis/Rheumatoid in her mother; Rheumatic fever  in her mother; Stroke in her mother.  ROS:   Please see the history of present illness.    ROS  All other systems reviewed and negative.   EKGs/Labs/Other Studies Reviewed:    The following studies were reviewed today: none  EKG:  EKG is  ordered today.  The ekg ordered today demonstrates NSR at 65bpm with no ST changes  Recent Labs: No results found for requested labs within last 8760 hours.   Recent Lipid Panel No results found for: CHOL, TRIG, HDL, CHOLHDL, VLDL, LDLCALC, LDLDIRECT  Physical Exam:    VS:  BP 108/70   Pulse 65   Ht 5\' 10"  (1.778 m)   Wt 146 lb (66.2 kg)   BMI 20.95 kg/m     Wt Readings from Last 3 Encounters:  12/27/17 146 lb (66.2 kg)  12/08/16 150 lb 12 oz (68.4 kg)  11/13/15 150 lb 1.9 oz (68.1 kg)     GEN:  Well nourished, well developed in no acute distress HEENT: Normal NECK: No JVD; No carotid bruits LYMPHATICS:  No lymphadenopathy CARDIAC: RRR, no murmurs, rubs, gallops RESPIRATORY:  Clear to auscultation without rales, wheezing or rhonchi  ABDOMEN: Soft, non-tender, non-distended MUSCULOSKELETAL:  No edema; No deformity  SKIN: Warm and dry NEUROLOGIC:  Alert and oriented x 3 PSYCHIATRIC:  Normal affect   ASSESSMENT:    1. PAF (paroxysmal atrial fibrillation) (HCC)    PLAN:    In order of problems listed above:  1.  Paroxysmal atrial fibrillation -she is maintaining normal sinus rhythm on exam.  She denies any recent palpitations.  She will continue on Toprol-XL 25 mg daily.  She requires no anticoagulation long-term because of a CHADSVASC score of    Medication Adjustments/Labs and Tests Ordered: Current medicines are reviewed at length with the patient today.  Concerns regarding medicines are outlined above.  No orders of the defined types were placed in this encounter.  No orders of the defined types were placed in this encounter.   Signed, Armanda Magicraci Albirta Rhinehart, MD  12/27/2017 9:39 AM    [facility]

## 2017-12-27 NOTE — Patient Instructions (Signed)
Medication Instructions:  Your physician recommends that you continue on your current medications as directed. Please refer to the Current Medication list given to you today.  If you need a refill on your cardiac medications, please contact your pharmacy first.  Labwork: None ordered   Testing/Procedures: None ordered   Follow-Up: Your physician wants you to follow-up in: 1 year with Dr. Turner. You will receive a reminder letter in the mail two months in advance. If you don't receive a letter, please call our office to schedule the follow-up appointment.  Any Other Special Instructions Will Be Listed Below (If Applicable).   Thank you for choosing [facility]    Rena Glennis Montenegro, RN  [phone]  If you need a refill on your cardiac medications before your next appointment, please call your pharmacy.   

## 2018-03-20 ENCOUNTER — Encounter: Payer: Self-pay | Admitting: Cardiology

## 2018-03-21 ENCOUNTER — Other Ambulatory Visit: Payer: Self-pay | Admitting: Cardiology

## 2018-03-21 MED ORDER — METOPROLOL SUCCINATE ER 25 MG PO TB24: ORAL_TABLET | ORAL | 2 refills | Status: DC

## 2018-03-21 NOTE — Telephone Encounter (Signed)
Pt's medication was sent to pt's pharmacy as requested. Confirmation received.  °

## 2018-07-04 ENCOUNTER — Telehealth: Payer: Self-pay

## 2018-07-04 NOTE — Telephone Encounter (Signed)
   [facility] Pre-operative Risk Assessment    Request for surgical clearance:  1. What type of surgery is being performed? Fat grafting after implant removal   2. When is this surgery scheduled? TBD   3. What type of clearance is required (medical clearance vs. Pharmacy clearance to hold med vs. Both)? Medical   4. Are there any medications that need to be held prior to surgery and how long? None   5. Practice name and name of physician performing surgery? Dr. Darreld Mclean, Cosmetic Surgery     6. What is your office phone number: [phone]    1.   What is your office fax number: [phone]  8.   Anesthesia type (None, local, MAC, general) ? General for 1 hour   Sarina Ill 07/04/2018, 3:39 PM  _________________________________________________________________   (provider comments below)

## 2018-07-06 NOTE — Telephone Encounter (Signed)
   Primary Cardiologist: Armanda Magic, MD  Chart reviewed as part of pre-operative protocol coverage. Patient was contacted 07/06/2018 in reference to pre-operative risk assessment for pending surgery as outlined below.  Joann Murray was last seen on 12/27/2017 by Dr. Mayford Knife.  Since that day, Kinzey Sheriff has done well without any exertional chest pain or SOB.  Therefore, based on ACC/AHA guidelines, the patient would be at acceptable risk for the planned procedure without further cardiovascular testing.   I will route this recommendation to the requesting party via Epic fax function and remove from pre-op pool.  Please call with questions.  Patient does not have prior CAD history, coronary CT in 06/2015 showed normal coronaries. She is not on systemic anticoagulation due to low CHA2DS2-Vasc score. She says she is very active and able to climb at least 2 flights of stairs without any issue. Since she is able to complete at least 4 METS of activity without any chest pain, palpitation or SOB, she is cleared to proceed with surgery without further workup.   [place], [place] 07/06/2018, 11:14 AM

## 2018-08-23 ENCOUNTER — Other Ambulatory Visit: Payer: Self-pay

## 2018-08-23 MED ORDER — METOPROLOL SUCCINATE ER 25 MG PO TB24: ORAL_TABLET | ORAL | 0 refills | Status: DC

## 2019-03-22 ENCOUNTER — Other Ambulatory Visit: Payer: Self-pay | Admitting: Cardiology

## 2019-06-27 ENCOUNTER — Ambulatory Visit: Payer: Commercial Managed Care - PPO | Admitting: Physician Assistant

## 2019-06-28 ENCOUNTER — Encounter: Payer: Self-pay | Admitting: Physician Assistant

## 2019-06-28 ENCOUNTER — Ambulatory Visit (INDEPENDENT_AMBULATORY_CARE_PROVIDER_SITE_OTHER): Payer: Commercial Managed Care - PPO | Admitting: Physician Assistant

## 2019-06-28 ENCOUNTER — Other Ambulatory Visit: Payer: Self-pay

## 2019-06-28 VITALS — BP 142/84 | HR 73 | Ht 70.0 in | Wt 143.0 lb

## 2019-06-28 DIAGNOSIS — I480 Paroxysmal atrial fibrillation: Secondary | ICD-10-CM

## 2019-06-28 DIAGNOSIS — Z01818 Encounter for other preprocedural examination: Secondary | ICD-10-CM | POA: Diagnosis not present

## 2019-06-28 DIAGNOSIS — I341 Nonrheumatic mitral (valve) prolapse: Secondary | ICD-10-CM | POA: Diagnosis not present

## 2019-06-28 NOTE — Patient Instructions (Signed)

## 2019-06-28 NOTE — Progress Notes (Addendum)
Cardiology Office Note    Date:  06/28/2019   ID:  Joann Murray, DOB [birth year], MRN [id number]  PCP:  Patient, No Pcp Per  Cardiologist: Fransico Him, MD EPS: None  Chief Complaint  Patient presents with  . Pre-op Exam    History of Present Illness:  Joann Murray is a 58 y.o. female with history of nonsustained atrial tachycardia and PAF CHADSVASC=1, MVP with PVC's and diastolic dysfunction. Calcium score is 0 Normal Coronary CT 2016.   Patient comes in for surgical clearance by Dr. Nita Sells to put an implant barrier between breast and muscle from former implants. Patient denies chest pain, palpitations, dyspnea, dizziness or presyncope. Does ballet and weights.  She may actually have to delay her surgery because of a custody battle involving her grandchildren.   Past Medical History:  Diagnosis Date  . History of CT scan of chest    Cardiac CTA 10/16:  Normal coronary arteries, Ca score 0  . Murmur    normal LVF with G2DD on echo 2016  . Osteoarthritis   . Osteopenia   . Persistent atrial fibrillation (HCC)    CHADS2VASC score of 1  . Scoliosis     Past Surgical History:  Procedure Laterality Date  . TOE SURGERY      Current Medications: Current Meds  Medication Sig  . cholecalciferol (VITAMIN D) 1000 UNITS tablet Take 1,000 Units by mouth daily.  . metoprolol succinate (TOPROL-XL) 25 MG 24 hr tablet TAKE 1 TABLET DAILY (PLEASE KEEP UPCOMING APPOINTMENT FOR FURTHER REFILLS)  . Multiple Vitamin (MULTIVITAMIN) capsule Take 1 capsule by mouth daily.     Allergies:   Codeine   Social History   Socioeconomic History  . Marital status: Married    Spouse name: fredrick  . Number of children: 3  . Years of education: college  . Highest education level: Not on file  Occupational History  . Occupation: Psychologist, occupational  Social Needs  . Financial resource strain: Not on file  . Food insecurity    Worry: Not on file    Inability: Not on file  .  Transportation needs    Medical: Not on file    Non-medical: Not on file  Tobacco Use  . Smoking status: Former Research scientist (life sciences)  . Smokeless tobacco: Never Used  Substance and Sexual Activity  . Alcohol use: Yes    Alcohol/week: 0.0 standard drinks  . Drug use: No  . Sexual activity: Not on file  Lifestyle  . Physical activity    Days per week: Not on file    Minutes per session: Not on file  . Stress: Not on file  Relationships  . Social Herbalist on phone: Not on file    Gets together: Not on file    Attends religious service: Not on file    Active member of club or organization: Not on file    Attends meetings of clubs or organizations: Not on file    Relationship status: Not on file  Other Topics Concern  . Not on file  Social History Narrative  . Not on file     Family History:  The patient's   family history includes Arthritis/Rheumatoid in her mother; Rheumatic fever in her mother; Stroke in her mother.   ROS:   Please see the history of present illness.    ROS All other systems reviewed and are negative.   PHYSICAL EXAM:   VS:  BP (!) 142/84   Pulse 73  Ht 5\' 10"  (1.778 m)   Wt 143 lb (64.9 kg)   SpO2 98%   BMI 20.52 kg/m   Physical Exam  GEN: Thin, in no acute distress  Neck: no JVD, carotid bruits, or masses Cardiac:RRR; 2/6 systolic murmur LSB Respiratory:  clear to auscultation bilaterally, normal work of breathing GI: soft, nontender, nondistended, + BS Ext: without cyanosis, clubbing, or edema, Good distal pulses bilaterally MS: no deformity or atrophy  Skin: warm and dry, no rash Neuro:  Alert and Oriented x 3  Psych: euthymic mood, full affect  Wt Readings from Last 3 Encounters:  06/28/19 143 lb (64.9 kg)  12/27/17 146 lb (66.2 kg)  12/08/16 150 lb 12 oz (68.4 kg)      Studies/Labs Reviewed:   EKG:  EKG is  ordered today.  The ekg ordered today demonstrates NSR no acute change  Recent Labs: No results found for requested labs  within last 8760 hours.   Lipid Panel No results found for: CHOL, TRIG, HDL, CHOLHDL, VLDL, LDLCALC, LDLDIRECT  Additional studies/ records that were reviewed today include:  Echo 2016 Study Conclusions   - Left ventricle: The cavity size was normal. Systolic function was   vigorous. The estimated ejection fraction was in the range of 65%   to 70%. Wall motion was normal; there were no regional wall   motion abnormalities. Features are consistent with a pseudonormal   left ventricular filling pattern, with concomitant abnormal   relaxation and increased filling pressure (grade 2 diastolic   dysfunction). - Right ventricle: The cavity size was normal. Wall thickness was   normal. Systolic function was normal. - Global longitudinal strain -18.6%.     ASSESSMENT:    1. PAF (paroxysmal atrial fibrillation) (HCC)      PLAN:  In order of problems listed above:  Presurgical clearance  by Dr. 2017 to put an implant barrier between breast and muscle from former implants.  This will entail a 1 hour procedure under general anesthesia.  Calcium score 0 and coronary CT normal in 2016.  Patient has no cardiac symptoms.  RCRI 0.4 and METS are greater than 8 so no further cardiac work-up indicated prior to clearing her for surgery.  She can proceed. According to the Revised Cardiac Risk Index (RCRI), her Perioperative Risk of Major Cardiac Event is (%): 0.4  Her Functional Capacity in METs is: 8.23 according to the Duke Activity Status Index (DASI).  PAF CHADSVASC=1 so no anticoagulation only had one episode and hasn't had any since. On metoprolol.  Follow-up with Dr. 2017 in 1 year.  MVP asymptomatic  Diastolic dysfunction on echo in 2016.  No heart failure symptoms or edema.    Medication Adjustments/Labs and Tests Ordered: Current medicines are reviewed at length with the patient today.  Concerns regarding medicines are outlined above.  Medication changes, Labs and  Tests ordered today are listed in the Patient Instructions below. There are no Patient Instructions on file for this visit.   Signed, 2017, PA-C  06/28/2019 1:32 PM    [facility] [address] [place], [place], [place]  [place] Phone: [phone]; Fax: [phone]

## 2019-08-31 ENCOUNTER — Other Ambulatory Visit: Payer: Self-pay | Admitting: Cardiology

## 2019-11-24 ENCOUNTER — Ambulatory Visit: Payer: Commercial Managed Care - PPO

## 2020-02-27 ENCOUNTER — Other Ambulatory Visit: Payer: Self-pay | Admitting: Cardiology

## 2020-05-27 ENCOUNTER — Other Ambulatory Visit: Payer: Self-pay | Admitting: Cardiology

## 2020-07-04 ENCOUNTER — Ambulatory Visit: Payer: Commercial Managed Care - PPO | Admitting: Cardiology

## 2020-07-07 ENCOUNTER — Encounter: Payer: Self-pay | Admitting: Physician Assistant

## 2020-07-07 NOTE — Progress Notes (Signed)
Virtual Visit via Telephone Note   This visit type was conducted due to national recommendations for restrictions regarding the COVID-19 Pandemic (e.g. social distancing) in an effort to limit this patient's exposure and mitigate transmission in our community.  Due to her co-morbid illnesses, this patient is at least at moderate risk for complications without adequate follow up.  This format is felt to be most appropriate for this patient at this time.  The patient did not have access to video technology/had technical difficulties with video requiring transitioning to audio format only (telephone).  All issues noted in this document were discussed and addressed.  No physical exam could be performed with this format.  Please refer to the patient's chart for her  consent to telehealth [facility].   The patient was identified using 2 identifiers.  Date:  07/10/2020   ID:  Joann Murray, DOB 04/11/1961, MRN [id number]  Patient Location: Home Provider Location: Office/Clinic  PCP:  Patient, No Pcp Per  Cardiologist:  Armanda Magicraci Turner, MD  Electrophysiologist:  None   Evaluation Performed:  Follow-Up Visit  Chief Complaint:  F/u afib  History of Present Illness:    Joann PenningRebecca Murray is a 59 y.o. female with nonsustained atrial tachycardia, PAF, MVP, PACs, PVCs who presents for virtual follow-up.  She remotely established care with Dr. Mayford Knifeurner in 2016 for a heart monitor.  She had apparently been previously seen by a cardiologist 12 years prior and had a stress test that was normal and an echo that had shown mitral valve prolapse and PVCs. At that time she had an echocardiogram that was performed September 2016 showing an EF of 65 to 70%, grade 2 diastolic dysfunction, otherwise unremarkable.  She also had an exercise treadmill test that was somewhat abnormal with horizontal ST segment depression in several leads.  She subsequently underwent Myoview testing October 2016 that was also abnormal.  This  prompted a coronary CTA which ultimately showed normal coronary arteries.  At that time she also had an event monitor showing normal sinus rhythm with rare PACs.  This monitor was repeated in December 2016 which showed frequent PACs and atrial couplets, possible paroxysmal atrial fibrillation versus nonsustained atrial tachycardia, with occasional PVCs and ventricular couplets.  She was started on metoprolol and a 30-day monitor was repeated January 2017 to evaluate overall afib burden. This showed normal sinus rhythm, frequent PACs, occasional PVCs, nonsustained atrial tachycardia, atrial couplets and triplets without recurrent atrial fib.  Her CHA2DS2-VASc was previously felt to be 1 therefore she has not felt to require anticoagulation. She does appear to have a history of borderline elevated blood pressure in the past but at other times normal.  She reports she is doing extremely well without any new cardiac concerns.  She remains very active with regular exercise that includes the treadmill, bike, and volleyball.  She has not had any anginal symptoms or functional limitations.  She has not had any recent palpitations.  She was able to feel the events that prompted the event monitor several years ago.  She also relays that that was during a very stressful time in her life.  She is now separated and in a better place emotionally.  Her blood pressure is noted to be elevated.  She states that it may sometimes be this way if she is doing more than usual or under increased stress.  [facility] episod[facility] for sick care, but does not routinely see primary care.  Labs Independently Reviewed Only TSH on file - normal 2017  Past Medical History:  Diagnosis Date  . History of CT scan of chest    Cardiac CTA 10/16:  Normal coronary arteries, Ca score 0  . Murmur    normal LVF with G2DD on echo 2016  . Osteoarthritis   . Osteopenia   . PAF (paroxysmal atrial fibrillation) (HCC)   . PAT  (paroxysmal atrial tachycardia) (HCC)   . Persistent atrial fibrillation (HCC)    CHADS2VASC score of 1  . Premature atrial contractions   . PVC's (premature ventricular contractions)   . Scoliosis    Past Surgical History:  Procedure Laterality Date  . TOE SURGERY       Current Meds  Medication Sig  . Calcium Citrate-Vitamin D (CALCIUM CITRATE + D PO) Take 1,200 mg by mouth daily.  . cholecalciferol (VITAMIN D) 1000 UNITS tablet Take 1,000 Units by mouth daily.  . metoprolol succinate (TOPROL-XL) 25 MG 24 hr tablet Take 1 tablet (25 mg total) by mouth daily. Please contact our office to schedule follow up visit, prior to any refills. (1st request).  . Multiple Vitamin (MULTIVITAMIN) capsule Take 1 capsule by mouth daily.     Allergies:   Codeine   Social History   Tobacco Use  . Smoking status: Former Games developer  . Smokeless tobacco: Never Used  Substance Use Topics  . Alcohol use: Yes    Alcohol/week: 0.0 standard drinks  . Drug use: No     Family Hx: The patient's family history includes Arthritis/Rheumatoid in her mother; Rheumatic fever in her mother; Stroke in her mother.  ROS:   Please see the history of present illness.    All other systems reviewed and are negative.   Prior CV studies:   The following studies were reviewed today:  2D echo 2016  - Left ventricle: The cavity size was normal. Systolic function was  vigorous. The estimated ejection fraction was in the range of 65%  to 70%. Wall motion was normal; there were no regional wall  motion abnormalities. Features are consistent with a pseudonormal  left ventricular filling pattern, with concomitant abnormal  relaxation and increased filling pressure (grade 2 diastolic  dysfunction).  - Right ventricle: The cavity size was normal. Wall thickness was  normal. Systolic function was normal.  - Global longitudinal strain -18.6%.    Event monitor 09/2015  Normal sinus rhythm  Frequent  PACs, nonsustained atrial tachycardia, atrial couplets and triplets.  Occasional PVCs  Event Monitor 08/2015  Normal Sinus Rhythm. The avearage heart rate was 79bpm. The heart rate ranged from 48 to 149 bpm.  Frequent PACs and atrial couplets.  Paroxysmal atrial fibrillation vs. nonsustained atrial tachycardia. Most likely PAF given irregularity with no discernable P waves.  Occasional PVCs and ventricular couplets.  Cor CT 06/2015 CLINICAL DATA:  Chest pain EXAM: Cardiac CTA MEDICATIONS: Sub lingual nitro. 4mg  and lopressor 15mg  TECHNIQUE: The patient was scanned on a Siemens 128 Edge scanner. Gantry rotation speed was 280 msecs. Collimation was .53mm. A 100 kV prospective scan was triggered in the ascending thoracic aorta at 120 HU's with full mA at 60-80% of the R-R interval. Average HR during the scan was 60 bpm. The 3D data set was interpreted on a dedicated work station using MPR, MIP and VRT modes. A total of 80cc of contrast was used. FINDINGS: Non-cardiac: See separate report from [facility]. No significant findings on limited lung and soft tissue windows. Calcium Score: 0  Coronary Arteries: Right dominant with no anomalies LM: Normal LAD:  Normal D1:  Normal Small D2:  Normal Small Circumflex: Normal OM1: Normal OM2:  Normal poorly seen RCA:  Dominant and normal PDA: Normal PLA:  Normal IMPRESSION: 1) Normal right dominant coronary arteries distal circumflex somewhat poorly visualized 2) Calcium Score 0 Charlton Haws Electronically Signed   By: Charlton Haws M.D.   On: 07/04/2015 11:47  Signed by Wendall Stade, MD on 07/04/2015 11:50 AM  Narrative & Impression  EXAM: OVER-READ INTERPRETATION  CT CHEST  The following report is an over-read performed by radiologist Dr. Lesia Hausen [facility] on 07/04/2015. This over-read does not include interpretation of cardiac or coronary anatomy or pathology. The coronary calcium  score/coronary CTA interpretation by the cardiologist is attached.  COMPARISON:  No prior chest CT.  03/04/2009 CT abdomen/pelvis.  FINDINGS: There is a 4 mm solid subpleural anterior right lower lobe pulmonary nodule associated with the right major fissure (series 11/image 31), stable since 03/04/2009 and benign. No additional significant pulmonary nodules in the visualized chest. No acute consolidative airspace disease. Mild hypoventilatory changes in the dependent lower lobes. Unremarkable visualized upper abdomen. There are bilateral retroglandular intact appearing breast prostheses. No adenopathy in the visualized chest.  There is moderate dextroscoliosis of the thoracic spine with associated mild degenerative changes. No aggressive appearing focal osseous lesions.  IMPRESSION: No suspicious extracardiac findings in the visualized chest or upper abdomen. Moderate dextroscoliosis of the thoracic spine with associated degenerative changes.  Electronically Signed: By: Delbert Phenix M.D. On: 07/04/2015 10:49      Labs/Other Tests and Data Reviewed:    EKG:  An ECG dated 06/28/19 was personally reviewed today and demonstrated:  NSR 73bpm subtle baseline wander without acute STT changes, possible prior septal infarct by anterior leads although likely normal variant given history  Recent Labs: No results found for requested labs within last 8760 hours.   Recent Lipid Panel No results found for: CHOL, TRIG, HDL, CHOLHDL, LDLCALC, LDLDIRECT  Wt Readings from Last 3 Encounters:  07/10/20 143 lb (64.9 kg)  06/28/19 143 lb (64.9 kg)  12/27/17 146 lb (66.2 kg)     Objective:    Vital Signs:  BP (!) 143/90   Pulse 76   Ht 5\' 10"  (1.778 m)   Wt 143 lb (64.9 kg)   BMI 20.52 kg/m    VS reviewed. General - calm F in no acute distress Pulm - No labored breathing, no coughing during visit, no audible wheezing, speaking in full sentences Neuro - A+Ox3, no slurred speech,  answers questions appropriately Psych - Pleasant affect  ASSESSMENT & PLAN:    1. PAF- quiescent by symptoms. EKG in 2020 showed NSR. Virtually cannot assess rhythm today but she has not had any symptoms reminiscent of prior arrhythmias. Her CHA2DS2-VASc was previously felt to be 1 for female, however, given her intermittent high blood pressure there is a possibility it could actually be 2 for history of hypertension. See below for further monitoring of blood pressure.  Also do not see blood sugar previously on file. We will try to see if we can arrange for her to get an EKG when she comes in to bring her mom for an appointment on Monday to ensure she continues to remain in normal sinus rhythm.  She is also is in need of updated screening blood work especially since she does not have primary care.  We will set her up to come in for  CBC, CMET, TSH, and A1c. Based on this subsequent information I will likely reach out to Dr. Mayford Knife to discuss the potentially updated CHA2DS2-VASc/anticoagulation recommendations.  We did discuss home monitoring for atrial fibrillation and palpitation recurrences.  She is looking into the Apple watch band.  We also discussed Kardia option as well.  She will notify us if she has any recurrent palpitations. Refill metoprolol today. 2. Elevated blood pressure without prior diagnosis of hypertension - intermittently elevated in the past as well.  Instructions relayed to patient for monitoring blood pressure over the next several days.  Asked her to relay results to [place] in 4 days.  Consideration could be given to changing her metoprolol to carvedilol. Check blood work as above. 3. PAT/PVCs/PACs - quiescent by symptoms. 4. Encounter for lipid screening - check fasting lipid profile and CMET when she returns for labwork.   Time:   Today, I have spent 12 minutes with the patient with telehealth technology discussing the above problems.     Medication Adjustments/Labs and Tests  Ordered: Current medicines are reviewed at length with the patient today.  Testing and concerns regarding medicines are outlined above.    Follow Up:  In Person in 1 Year with Dr. Mayford Knife. Also encouraged establishment with primary care for routine health maintenance.  Signed, Laurann Montana, PA-C  07/10/2020 11:03 AM     [facility]

## 2020-07-10 ENCOUNTER — Telehealth: Payer: Self-pay | Admitting: *Deleted

## 2020-07-10 ENCOUNTER — Other Ambulatory Visit: Payer: Self-pay

## 2020-07-10 ENCOUNTER — Encounter: Payer: Self-pay | Admitting: Physician Assistant

## 2020-07-10 ENCOUNTER — Telehealth (INDEPENDENT_AMBULATORY_CARE_PROVIDER_SITE_OTHER): Payer: Commercial Managed Care - PPO | Admitting: Physician Assistant

## 2020-07-10 VITALS — BP 143/90 | HR 76 | Ht 70.0 in | Wt 143.0 lb

## 2020-07-10 DIAGNOSIS — I480 Paroxysmal atrial fibrillation: Secondary | ICD-10-CM | POA: Diagnosis not present

## 2020-07-10 DIAGNOSIS — I491 Atrial premature depolarization: Secondary | ICD-10-CM

## 2020-07-10 DIAGNOSIS — I493 Ventricular premature depolarization: Secondary | ICD-10-CM

## 2020-07-10 DIAGNOSIS — Z136 Encounter for screening for cardiovascular disorders: Secondary | ICD-10-CM

## 2020-07-10 DIAGNOSIS — R030 Elevated blood-pressure reading, without diagnosis of hypertension: Secondary | ICD-10-CM

## 2020-07-10 DIAGNOSIS — I471 Supraventricular tachycardia: Secondary | ICD-10-CM

## 2020-07-10 DIAGNOSIS — Z13220 Encounter for screening for lipoid disorders: Secondary | ICD-10-CM

## 2020-07-10 MED ORDER — METOPROLOL SUCCINATE ER 25 MG PO TB24: 25.0000 mg | ORAL_TABLET | Freq: Every day | ORAL | 3 refills | Status: DC

## 2020-07-10 NOTE — Telephone Encounter (Signed)
°  Patient Consent for Virtual Visit         Joann Murray has provided verbal consent on 07/10/2020 for a virtual visit (video or telephone).   CONSENT FOR VIRTUAL VISIT FOR:  Joann Murray  By participating in this virtual visit I agree to the following:  I hereby voluntarily request, consent and authorize [facility] and its employed or contracted physicians, physician assistants, nurse practitioners or other licensed health care professionals (the Practitioner), to provide me with telemedicine health care services (the Services") as deemed necessary by the treating Practitioner. I acknowledge and consent to receive the Services by the Practitioner via telemedicine. I understand that the telemedicine visit will involve communicating with the Practitioner through live audiovisual communication technology and the disclosure of certain medical information by electronic transmission. I acknowledge that I have been given the opportunity to request an in-person assessment or other available alternative prior to the telemedicine visit and am voluntarily participating in the telemedicine visit.  I understand that I have the right to withhold or withdraw my consent to the use of telemedicine in the course of my care at any time, without affecting my right to future care or treatment, and that the Practitioner or I may terminate the telemedicine visit at any time. I understand that I have the right to inspect all information obtained and/or recorded in the course of the telemedicine visit and may receive copies of available information for a reasonable fee.  I understand that some of the potential risks of receiving the Services via telemedicine include:   Delay or interruption in medical evaluation due to technological equipment failure or disruption;  Information transmitted may not be sufficient (e.g. poor resolution of images) to allow for appropriate medical decision making by the Practitioner;  and/or   In rare instances, security protocols could fail, causing a breach of personal health information.  Furthermore, I acknowledge that it is my responsibility to provide information about my medical history, conditions and care that is complete and accurate to the best of my ability. I acknowledge that Practitioner's advice, recommendations, and/or decision may be based on factors not within their control, such as incomplete or inaccurate data provided by me or distortions of diagnostic images or specimens that may result from electronic transmissions. I understand that the practice of medicine is not an exact science and that Practitioner makes no warranties or guarantees regarding treatment outcomes. I acknowledge that a copy of this consent can be made available to me via my patient portal [facility] MyChart), or I can request a printed copy by calling the office of [facility].    I understand that my insurance will be billed for this visit.   I have read or had this consent read to me.  I understand the contents of this consent, which adequately explains the benefits and risks of the Services being provided via telemedicine.   I have been provided ample opportunity to ask questions regarding this consent and the Services and have had my questions answered to my satisfaction.  I give my informed consent for the services to be provided through the use of telemedicine in my medical care

## 2020-07-10 NOTE — Patient Instructions (Signed)
Medication Instructions:  Your physician recommends that you continue on your current medications as directed. Please refer to the Current Medication list given to you today.  *If you need a refill on your cardiac medications before your next appointment, please call your pharmacy*   Lab Work: 07/15/2020:  Fasting LIPID, CMET, CBC, & TSH  If you have labs (blood work) drawn today and your tests are completely normal, you will receive your results only by: Marland Kitchen MyChart Message (if you have MyChart) OR . A paper copy in the mail If you have any lab test that is abnormal or we need to change your treatment, we will call you to review the results.   Testing/Procedures: Your physician recommends that you have a EKG.  Please come to the office 07/15/2020, arrive at 1:45 for this.    Follow-Up: At [facility], you and your health needs are our priority.  As part of our continuing mission to provide you with exceptional heart care, we have created designated Provider Care Teams.  These Care Teams include your primary Cardiologist (physician) and Advanced Practice Providers (APPs -  Physician Assistants and Nurse Practitioners) who all work together to provide you with the care you need, when you need it.  We recommend signing up for the patient portal called "MyChart".  Sign up information is provided on this After Visit Summary.  MyChart is used to connect with patients for Virtual Visits (Telemedicine).  Patients are able to view lab/test results, encounter notes, upcoming appointments, etc.  Non-urgent messages can be sent to your provider as well.   To learn more about what you can do with MyChart, go to [email or web address].    Your next appointment:   12 month(s)  The format for your next appointment:   In Person  Provider:   You may see Armanda Magic, MD or one of the following Advanced Practice Providers on your designated Care Team:    Ronie Spies, PA-C  Jacolyn Reedy,  PA-C    Other Instructions

## 2020-07-15 ENCOUNTER — Other Ambulatory Visit: Payer: Self-pay

## 2020-07-15 ENCOUNTER — Ambulatory Visit (INDEPENDENT_AMBULATORY_CARE_PROVIDER_SITE_OTHER): Payer: Commercial Managed Care - PPO

## 2020-07-15 ENCOUNTER — Other Ambulatory Visit: Payer: Commercial Managed Care - PPO | Admitting: *Deleted

## 2020-07-15 VITALS — BP 120/80 | HR 70 | Ht 70.0 in | Wt 144.0 lb

## 2020-07-15 DIAGNOSIS — Z136 Encounter for screening for cardiovascular disorders: Secondary | ICD-10-CM

## 2020-07-15 DIAGNOSIS — I4719 Other supraventricular tachycardia: Secondary | ICD-10-CM

## 2020-07-15 DIAGNOSIS — I480 Paroxysmal atrial fibrillation: Secondary | ICD-10-CM | POA: Diagnosis not present

## 2020-07-15 DIAGNOSIS — I491 Atrial premature depolarization: Secondary | ICD-10-CM

## 2020-07-15 DIAGNOSIS — Z13220 Encounter for screening for lipoid disorders: Secondary | ICD-10-CM

## 2020-07-15 DIAGNOSIS — I471 Supraventricular tachycardia: Secondary | ICD-10-CM

## 2020-07-15 DIAGNOSIS — R030 Elevated blood-pressure reading, without diagnosis of hypertension: Secondary | ICD-10-CM

## 2020-07-15 DIAGNOSIS — I493 Ventricular premature depolarization: Secondary | ICD-10-CM

## 2020-07-15 NOTE — Progress Notes (Addendum)
1.) Reason for visit: EKG  2.) Name of provider requesting visit: Ronie Spies, PA  3.) H&P: History of PAF w/ Recent Virtual Visit  4.) Assessment and plan per MD: Patient is completely asymptomatic. EKG shows NSR. No changes at this time.

## 2020-07-15 NOTE — Progress Notes (Addendum)
EKG reviewed, NSR 70bpm, nonspecific STT changes, similar to prior. She brings in BP log of readings ranging from 99/78-133/87, largely well controlled in the 1teens (average systolic BP 116). No change in plan as outlined.  Await labs.  Addendum 07/16/2020 1:43 PM - labs showed normal A1C, otherwise OK. Given normal BPs at home, CHADSVASC score remains 1 therefore no anticoagulation warranted. Reviewed with Dr. Mayford Knife who agrees. Patient given instructions via result note on monitoring for recurrent palpitations/afib through smartwatch.   Kaneisha Ellenberger PA-C

## 2020-07-16 LAB — LIPID PANEL
Chol/HDL Ratio: 2.7 ratio (ref 0.0–4.4)
Cholesterol, Total: 216 mg/dL — ABNORMAL HIGH (ref 100–199)
HDL: 81 mg/dL (ref >39)
LDL Chol Calc (NIH): 118 mg/dL — ABNORMAL HIGH (ref 0–99)
Triglycerides: 97 mg/dL (ref 0–149)
VLDL Cholesterol Cal: 17 mg/dL (ref 5–40)

## 2020-07-16 LAB — HEMOGLOBIN A1C
Est. average glucose Bld gHb Est-mCnc: 103 mg/dL
Hgb A1c MFr Bld: 5.2 % (ref 4.8–5.6)

## 2020-07-16 LAB — COMPREHENSIVE METABOLIC PANEL
ALT: 17 IU/L (ref 0–32)
AST: 21 IU/L (ref 0–40)
Albumin/Globulin Ratio: 1.8 (ref 1.2–2.2)
Albumin: 4.7 g/dL (ref 3.8–4.9)
Alkaline Phosphatase: 65 IU/L (ref 44–121)
BUN/Creatinine Ratio: 15 (ref 12–28)
BUN: 12 mg/dL (ref 8–27)
Bilirubin Total: 0.5 mg/dL (ref 0.0–1.2)
CO2: 24 mmol/L (ref 20–29)
Calcium: 10.2 mg/dL (ref 8.7–10.3)
Chloride: 102 mmol/L (ref 96–106)
Creatinine, Ser: 0.8 mg/dL (ref 0.57–1.00)
GFR calc Af Amer: 93 mL/min/{1.73_m2} (ref >59)
GFR calc non Af Amer: 80 mL/min/{1.73_m2} (ref >59)
Globulin, Total: 2.6 g/dL (ref 1.5–4.5)
Glucose: 96 mg/dL (ref 65–99)
Potassium: 4.4 mmol/L (ref 3.5–5.2)
Sodium: 140 mmol/L (ref 134–144)
Total Protein: 7.3 g/dL (ref 6.0–8.5)

## 2020-07-16 LAB — CBC
Hematocrit: 43.7 % (ref 34.0–46.6)
Hemoglobin: 14.7 g/dL (ref 11.1–15.9)
MCH: 30.9 pg (ref 26.6–33.0)
MCHC: 33.6 g/dL (ref 31.5–35.7)
MCV: 92 fL (ref 79–97)
Platelets: 295 10*3/uL (ref 150–450)
RBC: 4.76 x10E6/uL (ref 3.77–5.28)
RDW: 11.6 % — ABNORMAL LOW (ref 11.7–15.4)
WBC: 6.7 10*3/uL (ref 3.4–10.8)

## 2020-07-16 LAB — TSH: TSH: 1.19 u[IU]/mL (ref 0.450–4.500)

## 2020-07-24 ENCOUNTER — Other Ambulatory Visit: Payer: Self-pay

## 2020-07-24 MED ORDER — METOPROLOL SUCCINATE ER 25 MG PO TB24
25.0000 mg | ORAL_TABLET | Freq: Every day | ORAL | 3 refills | Status: DC
Start: 2020-07-24 — End: 2021-06-27

## 2021-06-27 ENCOUNTER — Other Ambulatory Visit: Payer: Self-pay | Admitting: Cardiology

## 2021-08-29 ENCOUNTER — Ambulatory Visit: Payer: Commercial Managed Care - PPO | Admitting: Cardiology

## 2021-09-25 ENCOUNTER — Other Ambulatory Visit: Payer: Self-pay | Admitting: Cardiology

## 2021-11-18 ENCOUNTER — Other Ambulatory Visit (HOSPITAL_COMMUNITY)
Admission: RE | Admit: 2021-11-18 | Discharge: 2021-11-18 | Disposition: A | Discharge status: Home / Self Care | Payer: Commercial Managed Care - PPO | Source: Ambulatory Visit | Attending: Obstetrics and Gynecology | Admitting: Obstetrics and Gynecology

## 2021-11-18 ENCOUNTER — Other Ambulatory Visit: Payer: Self-pay | Admitting: Obstetrics and Gynecology

## 2021-11-18 DIAGNOSIS — Z01419 Encounter for gynecological examination (general) (routine) without abnormal findings: Secondary | ICD-10-CM | POA: Insufficient documentation

## 2021-11-20 LAB — CYTOLOGY - PAP
Comment: NEGATIVE
Comment: NEGATIVE
Diagnosis: UNDETERMINED — AB
HPV 16: NEGATIVE
HPV 18 / 45: NEGATIVE
High risk HPV: POSITIVE — AB

## 2021-11-27 ENCOUNTER — Other Ambulatory Visit: Payer: Self-pay | Admitting: Obstetrics and Gynecology

## 2021-12-17 ENCOUNTER — Ambulatory Visit (INDEPENDENT_AMBULATORY_CARE_PROVIDER_SITE_OTHER): Payer: Commercial Managed Care - PPO | Admitting: Cardiology

## 2021-12-17 ENCOUNTER — Encounter: Payer: Self-pay | Admitting: Cardiology

## 2021-12-17 VITALS — BP 146/82 | HR 66 | Ht 68.5 in | Wt 148.6 lb

## 2021-12-17 DIAGNOSIS — I480 Paroxysmal atrial fibrillation: Secondary | ICD-10-CM | POA: Diagnosis not present

## 2021-12-17 DIAGNOSIS — R030 Elevated blood-pressure reading, without diagnosis of hypertension: Secondary | ICD-10-CM

## 2021-12-17 MED ORDER — METOPROLOL SUCCINATE ER 25 MG PO TB24: ORAL_TABLET | ORAL | 3 refills | Status: DC

## 2021-12-17 NOTE — Progress Notes (Signed)
?Cardiology Office Note:   ? ?Date:  12/17/2021  ? ?ID:  Joann Murray, DOB August 22, 1961, MRN [id number] ? ?PCP:  Patient, No Pcp Per (Inactive)  ?Cardiologist:  Armanda Magic, MD   ? ?Referring MD: No ref. provider found  ? ?Chief Complaint  ?Patient presents with  ? Atrial Fibrillation  ? ? ?History of Present Illness:   ? ?Joann Murray is a 61 y.o. female with a hx of mitral valve prolapse with PVCs and diastolic dysfunction on 2D echocardiogram in 2016.  She also has a history of nonsustained atrial tachycardia and paroxysmal atrial fibrillation with a CHADSVASC score of 1.   ? ?She is here today for followup and is doing well.  She denies any chest pain or pressure, SOB, DOE, PND, orthopnea, LE edema, dizziness, palpitations or syncope. she is compliant with her meds and is tolerating meds with no SE.    ? ?Past Medical History:  ?Diagnosis Date  ? History of CT scan of chest   ? Cardiac CTA 10/16:  Normal coronary arteries, Ca score 0  ? Murmur   ? normal LVF with G2DD on echo 2016  ? Osteoarthritis   ? Osteopenia   ? PAF (paroxysmal atrial fibrillation) (HCC)   ? PAT (paroxysmal atrial tachycardia) (HCC)   ? Persistent atrial fibrillation (HCC)   ? CHADS2VASC score of 1  ? Premature atrial contractions   ? PVC's (premature ventricular contractions)   ? Scoliosis   ? ? ?Past Surgical History:  ?Procedure Laterality Date  ? TOE SURGERY    ? ? ?Current Medications: ?Current Meds  ?Medication Sig  ? cholecalciferol (VITAMIN D) 1000 UNITS tablet Take 5,000 Units by mouth daily.  ? metoprolol succinate (TOPROL-XL) 25 MG 24 hr tablet TAKE 1 TABLET DAILY (KEEP UPCOMING APPOINTMENT IN DECEMBER 2022 WITH DR Almee Pelphrey BEFORE ANYMORE REFILLS)  ? Multiple Vitamin (MULTIVITAMIN) capsule Take 1 capsule by mouth daily.  ?  ? ?Allergies:   Codeine  ? ?Social History  ? ?Socioeconomic History  ? Marital status: Legally Separated  ?  Spouse name: fredrick  ? Number of children: 3  ? Years of education: college  ? Highest education  level: Not on file  ?Occupational History  ? Occupation: Agricultural consultant  ?Tobacco Use  ? Smoking status: Former  ? Smokeless tobacco: Never  ?Substance and Sexual Activity  ? Alcohol use: Yes  ?  Alcohol/week: 0.0 standard drinks  ? Drug use: No  ? Sexual activity: Not on file  ?Other Topics Concern  ? Not on file  ?Social History Narrative  ? Not on file  ? ?Social Determinants of Health  ? ?Financial Resource Strain: Not on file  ?Food Insecurity: Not on file  ?Transportation Needs: Not on file  ?Physical Activity: Not on file  ?Stress: Not on file  ?Social Connections: Not on file  ?  ? ?Family History: ?The patient's family history includes Arthritis/Rheumatoid in her mother; Rheumatic fever in her mother; Stroke in her mother. ? ?ROS:   ?Please see the history of present illness.    ?ROS  ?All other systems reviewed and negative.  ? ?EKGs/Labs/Other Studies Reviewed:   ? ?The following studies were reviewed today: ?none ? ?EKG:  EKG is  ordered today.  The ekg ordered today demonstrates NSR with PAC ? ?Recent Labs: ?No results found for requested labs within last 8760 hours.  ? ?Recent Lipid Panel ?   ?Component Value Date/Time  ? CHOL 216 (H) 07/15/2020 1401  ? TRIG 97  07/15/2020 1401  ? HDL 81 07/15/2020 1401  ? CHOLHDL 2.7 07/15/2020 1401  ? LDLCALC 118 (H) 07/15/2020 1401  ? ? ?Physical Exam:   ? ?VS:  BP (!) 146/82   Pulse 66   Ht 5' 8.5" (1.74 m)   Wt 148 lb 9.6 oz (67.4 kg)   SpO2 97%   BMI 22.27 kg/m?    ? ?Wt Readings from Last 3 Encounters:  ?12/17/21 148 lb 9.6 oz (67.4 kg)  ?07/15/20 144 lb (65.3 kg)  ?07/10/20 143 lb (64.9 kg)  ?  ? ?GEN: Well nourished, well developed in no acute distress ?HEENT: Normal ?NECK: No JVD; No carotid bruits ?LYMPHATICS: No lymphadenopathy ?CARDIAC:RRR, no murmurs, rubs, gallops ?RESPIRATORY:  Clear to auscultation without rales, wheezing or rhonchi  ?ABDOMEN: Soft, non-tender, non-distended ?MUSCULOSKELETAL:  No edema; No deformity  ?SKIN: Warm and dry ?NEUROLOGIC:   Alert and oriented x 3 ?PSYCHIATRIC:  Normal affect   ?ASSESSMENT:   ? ?1. PAF (paroxysmal atrial fibrillation) (HCC)   ?2. Elevated blood pressure reading in office without diagnosis of hypertension   ? ?PLAN:   ? ?In order of problems listed above: ? ?1.  Paroxysmal atrial fibrillation  ?-She continues to maintain normal sinus rhythm and denies any palpitations  ?-Continue prescription drug management with Toprol XL 25 mg daily with as needed refills  ?-She requires no anticoagulation long-term because of a CHADSVASC score of  ? ?2.  Elevated BP with no hx of HTN ?-I will get a 48 hr BP monitor ? ? ?Medication Adjustments/Labs and Tests Ordered: ?Current medicines are reviewed at length with the patient today.  Concerns regarding medicines are outlined above.  ?Orders Placed This Encounter  ?Procedures  ? EKG 12-Lead  ? ?No orders of the defined types were placed in this encounter. ? ? ?Signed, ?Armanda Magic, MD  ?12/17/2021 11:20 AM    ?[facility] ?

## 2021-12-17 NOTE — Patient Instructions (Signed)
Medication Instructions:  ?Your physician recommends that you continue on your current medications as directed. Please refer to the Current Medication list given to you today. ? ?*If you need a refill on your cardiac medications before your next appointment, please call your pharmacy* ? ?Testing/Procedures: ?Dr. Mayford Knife has recommended that you wear a blood pressure monitor.  ? ?Follow-Up: ?At [facility], you and your health needs are our priority.  As part of our continuing mission to provide you with exceptional heart care, we have created designated Provider Care Teams.  These Care Teams include your primary Cardiologist (physician) and Advanced Practice Providers (APPs -  Physician Assistants and Nurse Practitioners) who all work together to provide you with the care you need, when you need it. ?  ? ?Your next appointment:   ?1 year(s) ? ?The format for your next appointment:   ?In Person ? ?Provider:   ?Armanda Magic, MD   ? ? ? ?

## 2021-12-17 NOTE — Addendum Note (Signed)
Addended by: Antonieta Iba on: 12/17/2021 11:24 AM ? ? Modules accepted: Orders ? ?

## 2021-12-25 ENCOUNTER — Telehealth: Payer: Self-pay | Admitting: *Deleted

## 2021-12-25 NOTE — Telephone Encounter (Signed)
Patient scheduled Monday, 12/29/21, 10:30 AM, for 24 Hour Ambulatory Blood Pressure Monitor. ?

## 2021-12-29 ENCOUNTER — Ambulatory Visit (INDEPENDENT_AMBULATORY_CARE_PROVIDER_SITE_OTHER): Payer: Commercial Managed Care - PPO

## 2021-12-29 DIAGNOSIS — R030 Elevated blood-pressure reading, without diagnosis of hypertension: Secondary | ICD-10-CM | POA: Diagnosis not present

## 2021-12-29 DIAGNOSIS — I480 Paroxysmal atrial fibrillation: Secondary | ICD-10-CM | POA: Diagnosis not present

## 2021-12-29 NOTE — Progress Notes (Unsigned)
24 Hour ambulatory blood pressure monitor applied to patients left arm using standard adult cuff. ?

## 2022-01-08 ENCOUNTER — Ambulatory Visit
Admission: RE | Admit: 2022-01-08 | Discharge: 2022-01-08 | Disposition: A | Discharge status: Home / Self Care | Payer: Commercial Managed Care - PPO | Source: Ambulatory Visit | Attending: Obstetrics and Gynecology | Admitting: Obstetrics and Gynecology

## 2022-01-08 DIAGNOSIS — M8580 Other specified disorders of bone density and structure, unspecified site: Secondary | ICD-10-CM

## 2022-03-06 ENCOUNTER — Encounter: Payer: Self-pay | Admitting: Cardiology

## 2022-03-11 ENCOUNTER — Ambulatory Visit (INDEPENDENT_AMBULATORY_CARE_PROVIDER_SITE_OTHER): Payer: Commercial Managed Care - PPO

## 2022-03-11 DIAGNOSIS — I10 Essential (primary) hypertension: Secondary | ICD-10-CM

## 2022-03-11 NOTE — Progress Notes (Signed)
   Nurse Visit   Date of Encounter: 03/11/2022 ID: Kamillah Didonato, DOB 1961/01/11, MRN [id number]  PCP:  Patient, No Pcp Per   [facility] Providers Cardiologist:  Armanda Magic, MD      Visit Details   VS:  BP 132/80 (BP Location: Right Arm)   Pulse 70   Wt 146 lb 6.4 oz (66.4 kg)   BMI 21.94 kg/m  , BMI Body mass index is 21.94 kg/m.  Wt Readings from Last 3 Encounters:  03/11/22 146 lb 6.4 oz (66.4 kg)  12/17/21 148 lb 9.6 oz (67.4 kg)  07/15/20 144 lb (65.3 kg)     Reason for visit: Check home BP cuff  Performed today: Vital signs, and Education Changes (medications, testing, etc.) : NONE Length of Visit: 20 minutes  I checked the patients blood pressure manually in the right and left arm; Then assessed her blood pressure with the patients automatic blood pressure ( Home ) cuff.    RA manual:  132/80,  pulse 70 RA home cuff  129/91 pulse 75    LA manual:  132/78 LA home cuff:  139/92    Patient reports home blood pressure cuff is greater than 84 years old.   She will purchase a NEW BP cuff, and will send in readings as needed.    Signed, Elmore Guise, RN  03/11/2022 2:45 PM

## 2022-03-23 ENCOUNTER — Encounter: Payer: Self-pay | Admitting: Plastic Surgery

## 2022-03-23 ENCOUNTER — Ambulatory Visit (INDEPENDENT_AMBULATORY_CARE_PROVIDER_SITE_OTHER): Payer: Self-pay | Admitting: Plastic Surgery

## 2022-03-23 DIAGNOSIS — I10 Essential (primary) hypertension: Secondary | ICD-10-CM | POA: Insufficient documentation

## 2022-03-23 DIAGNOSIS — Z719 Counseling, unspecified: Secondary | ICD-10-CM

## 2022-03-23 NOTE — Progress Notes (Addendum)

## 2022-03-24 ENCOUNTER — Other Ambulatory Visit: Payer: Self-pay | Admitting: Cardiology

## 2022-04-01 ENCOUNTER — Institutional Professional Consult (permissible substitution): Payer: Commercial Managed Care - PPO | Admitting: Plastic Surgery

## 2022-04-14 ENCOUNTER — Encounter: Payer: Self-pay | Admitting: Cardiology

## 2022-07-08 ENCOUNTER — Encounter: Payer: Self-pay | Admitting: Gastroenterology

## 2022-07-08 NOTE — Progress Notes (Signed)
Documentation of outside records  June 2023 [facility] GI clinic note Patient being evaluated for follow-up colonoscopy. Colonoscopy in 2012 showed a tubular adenoma and otherwise was normal in route a recall for 5 years was placed.  She ended up getting set up for a colonoscopy in October.  October 2023 colonoscopy 1 3 mm polyp in the ascending colon, removed with a cold biopsy forceps.  Resected and retrieved. A 18 mm polypoid lesion was found in the proximal transverse colon.  The lesion was polypoid.  The no bleeding was present.  Biopsies were taken with a cold forceps for histology.  Area was tattooed with injection of 2 mils of Spot. Internal hemorrhoids.  Pathology Ascending colon biopsy tubular adenoma Transverse colon biopsy sessile serrated adenoma  These results will be scanned into the chart and available for clinic.  The patient is referred for advanced polypectomy attempt.  The patient will be scheduled for a clinic visit.  She will be offered a colonoscopy slot (75-minute EMR) versus scheduling after she is seen in clinic.  My team will reach out to the patient to work on having this arranged.   Justice Britain, MD [facility] Advanced Endoscopy Office # [phone]

## 2022-07-08 NOTE — Progress Notes (Signed)
RB, Look forward to meeting her in January since this is what she would like. GM

## 2022-10-12 NOTE — Progress Notes (Signed)
Rovonda, Thank you for the update.  FYI VS

## 2022-11-24 ENCOUNTER — Other Ambulatory Visit: Payer: Self-pay | Admitting: Obstetrics and Gynecology

## 2022-11-24 ENCOUNTER — Other Ambulatory Visit (HOSPITAL_COMMUNITY)
Admission: RE | Admit: 2022-11-24 | Discharge: 2022-11-24 | Disposition: A | Discharge status: Home / Self Care | Payer: Commercial Managed Care - PPO | Source: Ambulatory Visit | Attending: Obstetrics and Gynecology | Admitting: Obstetrics and Gynecology

## 2022-11-24 DIAGNOSIS — Z01419 Encounter for gynecological examination (general) (routine) without abnormal findings: Secondary | ICD-10-CM | POA: Diagnosis present

## 2022-11-30 LAB — CYTOLOGY - PAP
Comment: NEGATIVE
Diagnosis: NEGATIVE
High risk HPV: NEGATIVE

## 2022-12-02 ENCOUNTER — Ambulatory Visit: Payer: Commercial Managed Care - PPO | Admitting: Gastroenterology

## 2022-12-21 ENCOUNTER — Other Ambulatory Visit: Payer: Self-pay | Admitting: Cardiology

## 2022-12-25 ENCOUNTER — Telehealth: Payer: Self-pay | Admitting: Cardiology

## 2022-12-25 NOTE — Telephone Encounter (Signed)
Patient is requesting to switch from Dr. Turner to Dr. Christopher. Please advise. 

## 2022-12-25 NOTE — Telephone Encounter (Signed)
Ok by me

## 2023-02-11 ENCOUNTER — Encounter (HOSPITAL_BASED_OUTPATIENT_CLINIC_OR_DEPARTMENT_OTHER): Payer: Self-pay | Admitting: Cardiology

## 2023-02-11 ENCOUNTER — Ambulatory Visit (INDEPENDENT_AMBULATORY_CARE_PROVIDER_SITE_OTHER): Payer: Commercial Managed Care - PPO | Admitting: Cardiology

## 2023-02-11 VITALS — BP 124/80 | HR 68 | Ht 70.0 in | Wt 143.4 lb

## 2023-02-11 DIAGNOSIS — I10 Essential (primary) hypertension: Secondary | ICD-10-CM

## 2023-02-11 DIAGNOSIS — I480 Paroxysmal atrial fibrillation: Secondary | ICD-10-CM

## 2023-02-11 DIAGNOSIS — Z7189 Other specified counseling: Secondary | ICD-10-CM | POA: Diagnosis not present

## 2023-02-11 DIAGNOSIS — R42 Dizziness and giddiness: Secondary | ICD-10-CM

## 2023-02-11 NOTE — Patient Instructions (Signed)
Medication Instructions:  STOP Amlodipine  TAKE half tablet of Metoprolol Succinate (Toprol-XL) for 10 days then STOP   *If you need a refill on your cardiac medications before your next appointment, please call your pharmacy*  Lab Work: NONE ordered at this time of appointment   If you have labs (blood work) drawn today and your tests are completely normal, you will receive your results only by: MyChart Message (if you have MyChart) OR A paper copy in the mail If you have any lab test that is abnormal or we need to change your treatment, we will call you to review the results.  Testing/Procedures: NONE ordered at this time of appointment   Follow-Up: At [facility], you and your health needs are our priority.  As part of our continuing mission to provide you with exceptional heart care, we have created designated Provider Care Teams.  These Care Teams include your primary Cardiologist (physician) and Advanced Practice Providers (APPs -  Physician Assistants and Nurse Practitioners) who all work together to provide you with the care you need, when you need it.  Your next appointment:   3 month(s)  Provider:   Jodelle Red, MD    Other Instructions CONTINUE to monitor blood pressure and heart rate at home and send reading via MyChart  Most BP cuffs are pretty good. [employer] cuffs have a good reputation too.

## 2023-02-11 NOTE — Progress Notes (Signed)
Cardiology Office Note:    Date:  02/11/2023   ID:  Cedra Etsitty, DOB 08/07/1961, MRN [id number]  PCP:  Patient, No Pcp Per  Cardiologist:  Jodelle Red, MD  History of Present Illness:    Joann Murray is a 62 y.o. female with a hx of paroxysmal A-fib, paroxysmal atrial tachycardia, and PVCs who is seen as a new patient for the evaluation and management of PAF. She was previously established with Dr. Mayford Knife.   Today, she is overall well. She states having palpitations prior to having A-fib. She previously had life changes and stressors at the time. She notes that since starting metoprolol, she has noticed her blood pressure has been elevated. She has a wrist blood pressure cuff, which she has not used as often.   She has not been exercising or doing cardio as often as she used to. She recently started weight exercising again. She has a history of osteopenia, so she has been exercising with caution. She states she has broken her hip a few times.   She reports a near syncopal episode that lasted a few seconds after bending over. She at the time also had vertigo, and she is unsure if this was a factor. After following up with her PCP she reports all tests were normal and she was started on amlodipine.   Overall she maintains a healthy diet. She does not drink coffee.   She denies any chest pain, shortness of breath, or peripheral edema. No lightheadedness, headaches, orthopnea, or PND.  Past Medical History:  Diagnosis Date   History of CT scan of chest    Cardiac CTA 10/16:  Normal coronary arteries, Ca score 0   Murmur    normal LVF with G2DD on echo 2016   Osteoarthritis    Osteopenia    PAF (paroxysmal atrial fibrillation) (HCC)    PAT (paroxysmal atrial tachycardia)    Persistent atrial fibrillation (HCC)    CHADS2VASC score of 1   Premature atrial contractions    PVC's (premature ventricular contractions)    Scoliosis     Past Surgical History:  Procedure  Laterality Date   TOE SURGERY      Current Medications: Current Outpatient Medications on File Prior to Visit  Medication Sig   Calcium Carb-Cholecalciferol (CALCIUM 600 + D PO) Take 1 tablet by mouth in the morning and at bedtime.   cephALEXin (KEFLEX) 500 MG capsule Take 500 mg by mouth 2 (two) times daily.   cholecalciferol (VITAMIN D) 1000 UNITS tablet Take 5,000 Units by mouth daily.   Multiple Vitamin (MULTIVITAMIN) capsule Take 1 capsule by mouth daily.   clobetasol (TEMOVATE) 0.05 % external solution as needed.   Moringa 500 MG CAPS Take 5,000 mg by mouth daily.   No current facility-administered medications on file prior to visit.     Allergies:   Codeine   Social History   Tobacco Use   Smoking status: Former   Smokeless tobacco: Never  Substance Use Topics   Alcohol use: Yes    Alcohol/week: 0.0 standard drinks of alcohol   Drug use: No    Family History: family history includes Arthritis/Rheumatoid in her mother; Rheumatic fever in her mother; Stroke in her mother.  ROS:   Please see the history of present illness.  Additional pertinent ROS: Constitutional: Negative for chills, fever, night sweats, unintentional weight loss  HENT: Negative for ear pain and hearing loss.   Eyes: Negative for loss of vision and eye pain.  Respiratory: Negative  for cough, sputum, wheezing.   Cardiovascular: See HPI. Gastrointestinal: Negative for abdominal pain, melena, and hematochezia.  Genitourinary: Negative for dysuria and hematuria.  Musculoskeletal: Negative for falls and myalgias.  Skin: Negative for itching and rash.  Neurological: Negative for focal weakness, focal sensory changes and loss of consciousness.  Endo/Heme/Allergies: Does not bruise/bleed easily.     EKGs/Labs/Other Studies Reviewed:    The following studies were reviewed today:  Echo 05/2015:  Study Conclusions  - Left ventricle: The cavity size was normal. Systolic function was    vigorous. The  estimated ejection fraction was in the range of 65%    to 70%. Wall motion was normal; there were no regional wall    motion abnormalities. Features are consistent with a pseudonormal    left ventricular filling pattern, with concomitant abnormal    relaxation and increased filling pressure (grade 2 diastolic    dysfunction).  - Right ventricle: The cavity size was normal. Wall thickness was    normal. Systolic function was normal.  - Global longitudinal strain -18.6%.    EKG:  EKG is personally reviewed.   02/11/2023: NSR at 68 bpm   Recent Labs: No results found for requested labs within last 365 days.  Recent Lipid Panel    Component Value Date/Time   CHOL 216 (H) 07/15/2020 1401   TRIG 97 07/15/2020 1401   HDL 81 07/15/2020 1401   CHOLHDL 2.7 07/15/2020 1401   LDLCALC 118 (H) 07/15/2020 1401    Physical Exam:    VS:  BP 124/80   Pulse 68   Ht 5\' 10"  (1.778 m)   Wt 143 lb 6.4 oz (65 kg)   SpO2 98%   BMI 20.58 kg/m     Wt Readings from Last 3 Encounters:  02/11/23 143 lb 6.4 oz (65 kg)  03/23/22 144 lb (65.3 kg)  03/11/22 146 lb 6.4 oz (66.4 kg)    GEN: Well nourished, well developed in no acute distress HEENT: Normal, moist mucous membranes NECK: No JVD CARDIAC: regular rhythm, normal S1 and S2, no rubs or gallops. No murmur. VASCULAR: Radial and DP pulses 2+ bilaterally. No carotid bruits RESPIRATORY:  Clear to auscultation without rales, wheezing or rhonchi  ABDOMEN: Soft, non-tender, non-distended MUSCULOSKELETAL:  Ambulates independently SKIN: Warm and dry, no edema NEUROLOGIC:  Alert and oriented x 3. No focal neuro deficits noted. PSYCHIATRIC:  Normal affect    ASSESSMENT:    1. PAF (paroxysmal atrial fibrillation) (HCC)   2. Lightheadedness   3. Cardiac risk counseling   4. Counseling on health promotion and disease prevention    PLAN:    Lightheadedness -given BP in office, concern may be that she is intermittently hypotensive -will stop  amlodipine, taper off metoprolol, monitor for symptoms  Paroxysmal atrial fibrillation -CHA2DS2/VAS Stroke Risk Points=2 -not currently on anticoagulation -has symptoms prior to afib  Cardiac risk counseling and prevention recommendations: -recommend heart healthy/Mediterranean diet, with whole grains, fruits, vegetable, fish, lean meats, nuts, and olive oil. Limit salt. -recommend moderate walking, 3-5 times/week for 30-50 minutes each session. Aim for at least 150 minutes.week. Goal should be pace of 3 miles/hours, or walking 1.5 miles in 30 minutes -recommend avoidance of tobacco products. Avoid excess alcohol. -ASCVD risk score: The 10-year ASCVD risk score (Arnett DK, et al., 2019) is: 3.2%   Values used to calculate the score:     Age: 15 years     Sex: Female     Is Non-Hispanic African American: No  Diabetic: No     Tobacco smoker: No     Systolic Blood Pressure: 124 mmHg     Is BP treated: No     HDL Cholesterol: 81 mg/dL     Total Cholesterol: 216 mg/dL    Plan for follow up: 3 months or sooner if needed  Jodelle Red, MD, PhD, [facility]  [facility]  [place]  Heart & Vascular at [facility] at [facility] [address], [address] [place], [place] [address] [phone]   I,Rachel Rivera,acting as a scribe for Jodelle Red, MD.,have documented all relevant documentation on the behalf of Jodelle Red, MD,as directed by  Jodelle Red, MD while in the presence of Jodelle Red, MD.  I, Jodelle Red, MD, have reviewed all documentation for this visit. The documentation on 03/20/23 for the exam, diagnosis, procedures, and orders are all accurate and complete.   Signed, Jodelle Red, MD PhD 02/11/2023 9:19 AM    [facility]

## 2023-03-19 ENCOUNTER — Encounter (HOSPITAL_BASED_OUTPATIENT_CLINIC_OR_DEPARTMENT_OTHER): Payer: Self-pay | Admitting: Cardiology

## 2023-03-19 NOTE — Progress Notes (Incomplete)
Cardiology Office Note:    Date:  02/11/2023   ID:  Joann Murray, DOB 10/29/1960, MRN [id number]  PCP:  Patient, No Pcp Per  Cardiologist:  Jodelle Red, MD  History of Present Illness:    Joann Murray is a 62 y.o. female with a hx of persistent A-fib, paroxysmal atrial tachycardia, and PVCs who is seen as a new patient for the evaluation and management of PAF. She was previously established with Dr. Mayford Knife.   Today, she is overall well. She states having palpitations prior to having A-fib. She previously had life changes and stressors at the time. She notes that since starting metoprolol, she has noticed her blood pressure has been elevated. She has a wrist blood pressure cuff, which she has not used as often.   She has not been exercising or doing cardio as often as she used to. She recently started weight exercising again. She has a history of osteopenia, so she has been exercising with caution. She states she has broken her hip a few times.   She reports a near syncopal episode that lasted a few seconds after bending over. She at the time also had vertigo, and she is unsure if this was a factor. After following up with her PCP she reports all tests were normal and she was started on amlodipine.   Overall she maintains a healthy diet. She does not drink coffee.   She denies any chest pain, shortness of breath, or peripheral edema. No lightheadedness, headaches, orthopnea, or PND.  Past Medical History:  Diagnosis Date  . History of CT scan of chest    Cardiac CTA 10/16:  Normal coronary arteries, Ca score 0  . Murmur    normal LVF with G2DD on echo 2016  . Osteoarthritis   . Osteopenia   . PAF (paroxysmal atrial fibrillation) (HCC)   . PAT (paroxysmal atrial tachycardia)   . Persistent atrial fibrillation (HCC)    CHADS2VASC score of 1  . Premature atrial contractions   . PVC's (premature ventricular contractions)   . Scoliosis     Past Surgical History:   Procedure Laterality Date  . TOE SURGERY      Current Medications: Current Outpatient Medications on File Prior to Visit  Medication Sig  . Calcium Carb-Cholecalciferol (CALCIUM 600 + D PO) Take 1 tablet by mouth in the morning and at bedtime.  . cephALEXin (KEFLEX) 500 MG capsule Take 500 mg by mouth 2 (two) times daily.  . cholecalciferol (VITAMIN D) 1000 UNITS tablet Take 5,000 Units by mouth daily.  . Multiple Vitamin (MULTIVITAMIN) capsule Take 1 capsule by mouth daily.  . clobetasol (TEMOVATE) 0.05 % external solution as needed.  . Moringa 500 MG CAPS Take 5,000 mg by mouth daily.   No current facility-administered medications on file prior to visit.     Allergies:   Codeine   Social History   Tobacco Use  . Smoking status: Former  . Smokeless tobacco: Never  Substance Use Topics  . Alcohol use: Yes    Alcohol/week: 0.0 standard drinks of alcohol  . Drug use: No    Family History: family history includes Arthritis/Rheumatoid in her mother; Rheumatic fever in her mother; Stroke in her mother.  ROS:   Please see the history of present illness.  Additional pertinent ROS: Constitutional: Negative for chills, fever, night sweats, unintentional weight loss  HENT: Negative for ear pain and hearing loss.   Eyes: Negative for loss of vision and eye pain.  Respiratory: Negative  for cough, sputum, wheezing.   Cardiovascular: See HPI. Gastrointestinal: Negative for abdominal pain, melena, and hematochezia.  Genitourinary: Negative for dysuria and hematuria.  Musculoskeletal: Negative for falls and myalgias.  Skin: Negative for itching and rash.  Neurological: Negative for focal weakness, focal sensory changes and loss of consciousness.  Endo/Heme/Allergies: Does not bruise/bleed easily.     EKGs/Labs/Other Studies Reviewed:    The following studies were reviewed today:  Echo 05/2015:  Study Conclusions  - Left ventricle: The cavity size was normal. Systolic function  was    vigorous. The estimated ejection fraction was in the range of 65%    to 70%. Wall motion was normal; there were no regional wall    motion abnormalities. Features are consistent with a pseudonormal    left ventricular filling pattern, with concomitant abnormal    relaxation and increased filling pressure (grade 2 diastolic    dysfunction).  - Right ventricle: The cavity size was normal. Wall thickness was    normal. Systolic function was normal.  - Global longitudinal strain -18.6%.    EKG:  EKG is personally reviewed.   02/11/2023: Sinus ***. Rate *** bpm.   Recent Labs: No results found for requested labs within last 365 days.  Recent Lipid Panel    Component Value Date/Time   CHOL 216 (H) 07/15/2020 1401   TRIG 97 07/15/2020 1401   HDL 81 07/15/2020 1401   CHOLHDL 2.7 07/15/2020 1401   LDLCALC 118 (H) 07/15/2020 1401    Physical Exam:    VS:  BP 124/80   Pulse 68   Ht 5\' 10"  (1.778 m)   Wt 143 lb 6.4 oz (65 kg)   SpO2 98%   BMI 20.58 kg/m     Wt Readings from Last 3 Encounters:  02/11/23 143 lb 6.4 oz (65 kg)  03/23/22 144 lb (65.3 kg)  03/11/22 146 lb 6.4 oz (66.4 kg)    GEN: Well nourished, well developed in no acute distress HEENT: Normal, moist mucous membranes NECK: No JVD CARDIAC: regular rhythm, normal S1 and S2, no rubs or gallops. No murmur. VASCULAR: Radial and DP pulses 2+ bilaterally. No carotid bruits RESPIRATORY:  Clear to auscultation without rales, wheezing or rhonchi  ABDOMEN: Soft, non-tender, non-distended MUSCULOSKELETAL:  Ambulates independently SKIN: Warm and dry, no edema NEUROLOGIC:  Alert and oriented x 3. No focal neuro deficits noted. PSYCHIATRIC:  Normal affect    ASSESSMENT:    No diagnosis found. PLAN:     Cardiac risk counseling and prevention recommendations: -recommend heart healthy/Mediterranean diet, with whole grains, fruits, vegetable, fish, lean meats, nuts, and olive oil. Limit salt. -recommend moderate  walking, 3-5 times/week for 30-50 minutes each session. Aim for at least 150 minutes.week. Goal should be pace of 3 miles/hours, or walking 1.5 miles in 30 minutes -recommend avoidance of tobacco products. Avoid excess alcohol. -ASCVD risk score: The 10-year ASCVD risk score (Arnett DK, et al., 2019) is: 3.2%   Values used to calculate the score:     Age: 45 years     Sex: Female     Is Non-Hispanic African American: No     Diabetic: No     Tobacco smoker: No     Systolic Blood Pressure: 124 mmHg     Is BP treated: No     HDL Cholesterol: 81 mg/dL     Total Cholesterol: 216 mg/dL    Plan for follow up: 3 months or sooner if needed  Jodelle Red, MD, PhD, [facility]  [place]  [facility]  [place]  Heart & Vascular at [facility] at [facility] [address] [place], [place] [address] [phone]   Medication Adjustments/Labs and Tests Ordered: Current medicines are reviewed at length with the patient today.  Concerns regarding medicines are outlined above.  No orders of the defined types were placed in this encounter.  No orders of the defined types were placed in this encounter.  There are no Patient Instructions on file for this visit.    I,Rachel Rivera,acting as a scribe for [employer], MD.,have documented all relevant documentation on the behalf of Jodelle Red, MD,as directed by  Jodelle Red, MD while in the presence of Jodelle Red, MD.  ***  Signed, Jodelle Red, MD PhD 02/11/2023 9:19 AM    [facility]

## 2023-03-23 ENCOUNTER — Ambulatory Visit: Payer: Commercial Managed Care - PPO | Admitting: Gastroenterology

## 2023-05-11 ENCOUNTER — Encounter (HOSPITAL_BASED_OUTPATIENT_CLINIC_OR_DEPARTMENT_OTHER): Payer: Self-pay | Admitting: Cardiology

## 2023-05-11 ENCOUNTER — Ambulatory Visit (INDEPENDENT_AMBULATORY_CARE_PROVIDER_SITE_OTHER): Payer: Commercial Managed Care - PPO | Admitting: Cardiology

## 2023-05-11 VITALS — BP 128/88 | HR 69 | Ht 70.0 in | Wt 140.0 lb

## 2023-05-11 DIAGNOSIS — I480 Paroxysmal atrial fibrillation: Secondary | ICD-10-CM | POA: Diagnosis not present

## 2023-05-11 DIAGNOSIS — R42 Dizziness and giddiness: Secondary | ICD-10-CM

## 2023-05-11 DIAGNOSIS — Z7189 Other specified counseling: Secondary | ICD-10-CM | POA: Diagnosis not present

## 2023-05-11 NOTE — Progress Notes (Signed)
Cardiology Office Note:  .    Date:  05/11/2023  ID:  Joann Murray, DOB 1961/04/28, MRN [id number] PCP: Patient, No Pcp Per   [facility] Providers Cardiologist:  Jodelle Red, MD     History of Present Illness: .    Joann Murray is a 62 y.o. female with a hx of paroxysmal A-fib, paroxysmal atrial tachycardia, and PVCs, osteopenia, who is seen for follow-up today. I initially met her 02/11/2023 for the evaluation and management of PAF. She was previously established with Dr. Mayford Knife.    At our initial visit 01/2023, she reported experiencing palpitations prior to onset of atrial fibrillation episodes. Since starting metoprolol she noticed more elevated blood pressures. Formal exercise was limited as she was cautious with her history of osteopenia. In the past she had a near syncopal episode lasting a few seconds after bending over. Also had vertigo at the time. She followed up with her PCP and had normal workup. They initiated amlodipine. Given her blood pressure in the office, there was concern that she may be intermittently hypotensive. We stopped her amlodipine and planned to taper off metoprolol.   Today, she states she is feeling good. She presents a blood pressure log today which is personally reviewed, averaging 110s/80s at home. In the office today her blood pressure is stable at 128/88. Lately she has been dealing with some increased stress. Her mother has alzheimer's and has moved in with her.  No issues with palpitations recently. No rapid, quick, or abnormal heart beats. She feels that her Afib was likely stress-induced in the past.  For exercise she usually elevates her heart rate with weight training as opposed to intense cardio. No anginal symptoms.  She denies any chest pain, shortness of breath, peripheral edema, lightheadedness, headaches, syncope, orthopnea, or PND.  ROS:  Please see the history of present illness. ROS otherwise negative except as noted.  (+)  Stress  Studies Reviewed: Marland Kitchen           Physical Exam:    VS:  BP 128/88   Pulse 69   Ht 5\' 10"  (1.778 m)   Wt 140 lb (63.5 kg)   BMI 20.09 kg/m    Wt Readings from Last 3 Encounters:  05/11/23 140 lb (63.5 kg)  02/11/23 143 lb 6.4 oz (65 kg)  03/23/22 144 lb (65.3 kg)    GEN: Well nourished, well developed in no acute distress HEENT: Normal, moist mucous membranes NECK: No JVD CARDIAC: regular rhythm, normal S1 and S2, no rubs or gallops. No murmur. VASCULAR: Radial and DP pulses 2+ bilaterally. No carotid bruits RESPIRATORY:  Clear to auscultation without rales, wheezing or rhonchi  ABDOMEN: Soft, non-tender, non-distended MUSCULOSKELETAL:  Ambulates independently SKIN: Warm and dry, no edema NEUROLOGIC:  Alert and oriented x 3. No focal neuro deficits noted. PSYCHIATRIC:  Normal affect   ASSESSMENT AND PLAN: .    Lightheadedness -much improved since stopping metoprolol and amlodipine. BP numbers excellent on home checks   Paroxysmal atrial fibrillation vs atach (on monitor) -CHA2DS2/VAS Stroke Risk Points=2 -not currently on anticoagulation -has symptoms prior to arrhythmia, none recently   Cardiac risk counseling and prevention recommendations: -recommend heart healthy/Mediterranean diet, with whole grains, fruits, vegetable, fish, lean meats, nuts, and olive oil. Limit salt. -recommend moderate walking, 3-5 times/week for 30-50 minutes each session. Aim for at least 150 minutes.week. Goal should be pace of 3 miles/hours, or walking 1.5 miles in 30 minutes -recommend avoidance of tobacco products. Avoid excess alcohol. -ASCVD risk  score: The 10-year ASCVD risk score (Arnett DK, et al., 2019) is: 3.9%   Values used to calculate the score:     Age: 33 years     Sex: Female     Is Non-Hispanic African American: No     Diabetic: No     Tobacco smoker: No     Systolic Blood Pressure: 128 mmHg     Is BP treated: No     HDL Cholesterol: 81 mg/dL     Total  Cholesterol: 216 mg/dL   Dispo: Follow-up in 1 year, or sooner as needed.  I,Mathew Stumpf,acting as a Neurosurgeon for [employer], MD.,have documented all relevant documentation on the behalf of Jodelle Red, MD,as directed by  Jodelle Red, MD while in the presence of Jodelle Red, MD.  I, Jodelle Red, MD, have reviewed all documentation for this visit. The documentation on 05/11/23 for the exam, diagnosis, procedures, and orders are all accurate and complete.   Signed, Jodelle Red, MD

## 2023-05-11 NOTE — Patient Instructions (Signed)

## 2023-06-23 ENCOUNTER — Ambulatory Visit (INDEPENDENT_AMBULATORY_CARE_PROVIDER_SITE_OTHER): Payer: Commercial Managed Care - PPO | Admitting: Gastroenterology

## 2023-06-23 ENCOUNTER — Encounter: Payer: Self-pay | Admitting: Gastroenterology

## 2023-06-23 ENCOUNTER — Other Ambulatory Visit (INDEPENDENT_AMBULATORY_CARE_PROVIDER_SITE_OTHER): Payer: Commercial Managed Care - PPO

## 2023-06-23 VITALS — BP 114/78 | HR 68 | Ht 70.0 in | Wt 142.1 lb

## 2023-06-23 DIAGNOSIS — Z860101 Personal history of adenomatous and serrated colon polyps: Secondary | ICD-10-CM | POA: Diagnosis not present

## 2023-06-23 DIAGNOSIS — Z860100 Personal history of colon polyps, unspecified: Secondary | ICD-10-CM | POA: Diagnosis not present

## 2023-06-23 DIAGNOSIS — D123 Benign neoplasm of transverse colon: Secondary | ICD-10-CM | POA: Diagnosis not present

## 2023-06-23 LAB — BASIC METABOLIC PANEL
BUN: 14 mg/dL (ref 6–23)
CO2: 30 meq/L (ref 19–32)
Calcium: 10.3 mg/dL (ref 8.4–10.5)
Chloride: 102 meq/L (ref 96–112)
Creatinine, Ser: 0.83 mg/dL (ref 0.40–1.20)
GFR: 75.06 mL/min (ref >60.00)
Glucose, Bld: 94 mg/dL (ref 70–99)
Potassium: 4.3 meq/L (ref 3.5–5.1)
Sodium: 139 meq/L (ref 135–145)

## 2023-06-23 LAB — CBC
HCT: 44.7 % (ref 36.0–46.0)
Hemoglobin: 14.8 g/dL (ref 12.0–15.0)
MCHC: 33.1 g/dL (ref 30.0–36.0)
MCV: 91.6 fL (ref 78.0–100.0)
Platelets: 246 10*3/uL (ref 150.0–400.0)
RBC: 4.88 Mil/uL (ref 3.87–5.11)
RDW: 12.7 % (ref 11.5–15.5)
WBC: 6 10*3/uL (ref 4.0–10.5)

## 2023-06-23 MED ORDER — PEG 3350-KCL-NA BICARB-NACL 420 G PO SOLR: 4000.0000 mL | Freq: Once | ORAL | 0 refills | Status: AC

## 2023-06-23 NOTE — Progress Notes (Signed)
GASTROENTEROLOGY OUTPATIENT CLINIC VISIT   Primary Care Provider Patient, Joann Murray Joann address on file None  Referring Provider Joann Murray  Patient Profile: Joann Murray is a 62 y.o. female with a pmh significant for scoliosis, pAFib, PVCs, osteopenia, colon polyps (TAs & TC SSP in situ), family history colon polyps.  The patient presents to the [facility] for an evaluation and management of problem(s) noted below:  Problem List 1. Hx of adenomatous colonic polyps   2. Adenomatous polyp of transverse colon    Discussed the use of AI scribe software for clinical note transcription with the patient, who gave verbal consent to proceed.  History of Present Illness This is the patient's first visit to the [facility] GI clinic.  The patient was referred for consultation by Joann Murray for a larger, flat polyp identified during a colonoscopy in 2023.  We have had issues with trying to connect due to her mother's condition as she is her primary caregiver; but she is here now and ready to proceed with further evaluation.  The patient has had 1 prior colonoscopy before the one in 2023.  She reports that was normal ten years prior.  The patient reports regular bowel habits, with daily, well-formed bowel movements and Joann history of blood in the stool (melena/hematochezia).  The patient has Joann family history of colon or GI cancers.   GI Review of Systems Positive as above Negative for dysphagia, odynophagia, nausea, vomiting, pain, alteration in bowel habits  Review of Systems General: Denies fevers/chills/weight loss unintentionally Cardiovascular: Denies chest pain Pulmonary: Denies shortness of breath Gastroenterological: See HPI Genitourinary: Denies darkened urine  Hematological: Denies easy bruising/bleeding Dermatological: Denies jaundice Psychological: Mood is stable   Medications Current Outpatient Medications  Medication Sig Dispense Refill    cholecalciferol (VITAMIN D) 1000 UNITS tablet Take 5,000 Units by mouth daily.     clobetasol (TEMOVATE) 0.05 % external solution as needed.     Moringa 500 MG CAPS Take 5,000 mg by mouth daily.     Multiple Vitamin (MULTIVITAMIN) capsule Take 1 capsule by mouth daily.     polyethylene glycol-electrolytes (NULYTELY) 420 g solution Take 4,000 mLs by mouth once for 1 dose. For colonoscopy prep 4000 mL 0   Joann current facility-administered medications for this visit.    Allergies Allergies  Allergen Reactions   Codeine Itching    Histories Past Medical History:  Diagnosis Date   History of CT scan of chest    Cardiac CTA 10/16:  Normal coronary arteries, Ca score 0   Murmur    normal LVF with G2DD on echo 2016   Osteoarthritis    Osteopenia    PAF (paroxysmal atrial fibrillation) (HCC)    PAT (paroxysmal atrial tachycardia) (HCC)    Persistent atrial fibrillation (HCC)    CHADS2VASC score of 1   Premature atrial contractions    PVC's (premature ventricular contractions)    Scoliosis    Past Surgical History:  Procedure Laterality Date   COLONOSCOPY     TOE SURGERY     Social History   Socioeconomic History   Marital status: Legally Separated    Spouse name: Joann Murray   Number of children: 3   Years of education: college   Highest education level: Not on file  Occupational History   Occupation: volunteer  Tobacco Use   Smoking status: Former    Types: Cigarettes   Smokeless tobacco: Never  Vaping Use   Vaping status: Never Used  Substance  and Sexual Activity   Alcohol use: Yes    Alcohol/week: 7.0 standard drinks of alcohol    Types: 7 Glasses of wine Murray week    Comment: 1 glass of wine Murray day   Drug use: Joann   Sexual activity: Not on file  Other Topics Concern   Not on file  Social History Narrative   Not on file   Social Determinants of Health   Financial Resource Strain: Not on file  Food Insecurity: Not on file  Transportation Needs: Not on file   Physical Activity: Not on file  Stress: Not on file  Social Connections: Not on file  Intimate Partner Violence: Not on file   Family History  Problem Relation Age of Onset   Colon polyps Mother    Rheumatic fever Mother    Arthritis/Rheumatoid Mother    Stroke Mother    Alzheimer's disease Mother    Diverticulitis Mother    Esophageal cancer Neg Hx    Colon cancer Neg Hx    Pancreatic cancer Neg Hx    Stomach cancer Neg Hx    Diabetes Neg Hx    Heart disease Neg Hx    I have reviewed her medical, social, and family history in detail and updated the electronic medical record as necessary.    PHYSICAL EXAMINATION  BP 114/78   Pulse 68   Ht 5\' 10"  (1.778 m)   Wt 142 lb 2 oz (64.5 kg)   BMI 20.39 kg/m  Wt Readings from Last 3 Encounters:  06/23/23 142 lb 2 oz (64.5 kg)  05/11/23 140 lb (63.5 kg)  02/11/23 143 lb 6.4 oz (65 kg)   GEN: NAD, appears stated age, doesn't appear chronically ill PSYCH: Cooperative, without pressured speech EYE: Conjunctivae pink, sclerae anicteric ENT: MMM, without oral ulcers, Joann erythema or exudates noted NECK: Supple CV: RR without R/Gs  RESP: CTAB posteriorly, without wheezing GI: NABS, soft, NT/ND, without rebound or guarding, Joann HSM appreciated GU: DRE shows MSK/EXT: _ edema, Joann palmar erythema SKIN: Joann jaundice, Joann spider angiomata, Joann concerning rashes NEURO:  Alert & Oriented x 3, Joann focal deficits, Joann evidence of asterixis   REVIEW OF DATA  I reviewed the following data at the time of this encounter:  GI Procedures and Studies  October 2023 [facility] gastroenterology colonoscopy One 3 mm polyp in the ascending colon, removed with a cold biopsy forceps. Polypoid lesion in the transverse proximal colon.  Biopsied.  Tattooed.  (18 mm) Pathology Ascending colon-tubular adenoma Transverse colon-sessile serrated adenoma  Laboratory Studies  Reviewed those in epic  Imaging Studies  Joann relevant studies to review   ASSESSMENT   [name] is a 62 y.o. female with a pmh significant for scoliosis, pAFib, PVCs, osteopenia, colon polyps (TAs & TC SSP in situ), family history colon polyps.  The patient is seen today for evaluation and management of:  1. Hx of adenomatous colonic polyps   2. Adenomatous polyp of transverse colon    The patient is clinically and hemodynamically stable.  Based upon the description and endoscopic pictures I do feel that it is reasonable to pursue an Advanced Polypectomy attempt of the polyp/lesion.  We discussed some of the techniques of advanced polypectomy which include Endoscopic Mucosal Resection, OVESCO Full-Thickness Resection, Endorotor Morcellation, and Tissue Ablation via Fulguration.  We also reviewed images of typical techniques as noted above.  The risks and benefits of endoscopic evaluation were discussed with the patient; these include but are not limited  to the risk of perforation, infection, bleeding, missed lesions, lack of diagnosis, severe illness requiring hospitalization, as well as anesthesia and sedation related illnesses.  During attempts at advanced resection, the risks of bleeding and perforation/leak are increased as opposed to diagnostic and screening procedures, and that was discussed with the patient as well.   In addition, I explained that with the possible need for piecemeal resection, subsequent short-interval endoscopic evaluation for follow up and potential retreatment of the lesion/area may be necessary.  I did offer, a referral to surgery in order for patient to have opportunity to discuss surgical management/intervention prior to finalizing decision for attempt at endoscopic removal, however, the patient deferred on this.  If, after attempt at removal of the polyp/lesion, it is found that the patient has a complication or that an invasive lesion or malignant lesion is found, or that the polyp/lesion continues to recur, the patient is aware and understands that surgery may  still be indicated/required.  All patient questions were answered, to the best of my ability, and the patient agrees to the aforementioned plan of action with follow-up as indicated.  PLAN  Preprocedure labs as outlined below Proceed with scheduling colonoscopy with EMR attempt of transverse colon s/p Follow-up to be dictated based on results of colonoscopy   Orders Placed This Encounter  Procedures   Procedural/ Surgical Case Request: COLONOSCOPY WITH PROPOFOL, ENDOSCOPIC MUCOSAL RESECTION   CBC   Basic Metabolic Panel (BMET)   Ambulatory referral to Gastroenterology    New Prescriptions   POLYETHYLENE GLYCOL-ELECTROLYTES (NULYTELY) 420 G SOLUTION    Take 4,000 mLs by mouth once for 1 dose. For colonoscopy prep   Modified Medications   Joann medications on file    Planned Follow Up Joann follow-ups on file.   Total Time in Face-to-Face and in Coordination of Care for patient including independent/personal interpretation/review of prior testing, medical history, examination, medication adjustment, communicating results with the patient directly, and documentation within the EHR is 45 minutes.   Corliss Parish, MD [facility] Advanced Endoscopy Office # [phone]

## 2023-06-23 NOTE — Patient Instructions (Addendum)
You have been scheduled for an endoscopy and colonoscopy. Please follow the written instructions given to you at your visit today.  Please pick up your prep supplies at the pharmacy within the next 1-3 days.  If you use inhalers (even only as needed), please bring them with you on the day of your procedure.  DO NOT TAKE 7 DAYS PRIOR TO TEST- Trulicity (dulaglutide) Ozempic, Wegovy (semaglutide) Mounjaro (tirzepatide) Bydureon Bcise (exanatide extended release)  DO NOT TAKE 1 DAY PRIOR TO YOUR TEST Rybelsus (semaglutide) Adlyxin (lixisenatide) Victoza (liraglutide) Byetta (exanatide) __________________________________________________________   We have sent the following medications to your pharmacy for you to pick up at your convenience: Golytely  Your provider has requested that you go to the basement level for lab work before leaving today. Press "B" on the elevator. The lab is located at the first door on the left as you exit the elevator.    Due to recent changes in healthcare laws, you may see the results of your imaging and laboratory studies on MyChart before your provider has had a chance to review them.  We understand that in some cases there may be results that are confusing or concerning to you. Not all laboratory results come back in the same time frame and the provider may be waiting for multiple results in order to interpret others.  Please give [place] 48 hours in order for your provider to thoroughly review all the results before contacting the office for clarification of your results.   Thank you for choosing me and [facility].  Dr. Meridee Score

## 2023-09-09 ENCOUNTER — Encounter (HOSPITAL_COMMUNITY): Payer: Self-pay | Admitting: Gastroenterology

## 2023-09-09 NOTE — Progress Notes (Signed)
Attempted to obtain medical history for pre op call via telephone, unable to reach at this time. HIPAA compliant voicemail message left requesting return call to pre surgical testing department.

## 2023-09-17 ENCOUNTER — Encounter: Payer: Self-pay | Admitting: Gastroenterology

## 2023-09-20 ENCOUNTER — Encounter (HOSPITAL_COMMUNITY): Payer: Self-pay | Admitting: Gastroenterology

## 2023-09-20 ENCOUNTER — Ambulatory Visit (HOSPITAL_COMMUNITY): Payer: Commercial Managed Care - PPO | Admitting: Anesthesiology

## 2023-09-20 ENCOUNTER — Other Ambulatory Visit: Payer: Self-pay

## 2023-09-20 ENCOUNTER — Encounter (HOSPITAL_COMMUNITY)
Admission: RE | Disposition: A | Discharge status: Home / Self Care | Payer: Self-pay | Source: Home / Self Care | Attending: Gastroenterology

## 2023-09-20 ENCOUNTER — Ambulatory Visit (HOSPITAL_COMMUNITY)
Admission: RE | Admit: 2023-09-20 | Discharge: 2023-09-20 | Disposition: A | Discharge status: Home / Self Care | Payer: Commercial Managed Care - PPO | Attending: Gastroenterology | Admitting: Gastroenterology

## 2023-09-20 DIAGNOSIS — D127 Benign neoplasm of rectosigmoid junction: Secondary | ICD-10-CM | POA: Diagnosis not present

## 2023-09-20 DIAGNOSIS — K635 Polyp of colon: Secondary | ICD-10-CM | POA: Diagnosis not present

## 2023-09-20 DIAGNOSIS — D125 Benign neoplasm of sigmoid colon: Secondary | ICD-10-CM | POA: Insufficient documentation

## 2023-09-20 DIAGNOSIS — D122 Benign neoplasm of ascending colon: Secondary | ICD-10-CM | POA: Diagnosis not present

## 2023-09-20 DIAGNOSIS — K641 Second degree hemorrhoids: Secondary | ICD-10-CM | POA: Diagnosis not present

## 2023-09-20 DIAGNOSIS — Z87891 Personal history of nicotine dependence: Secondary | ICD-10-CM | POA: Diagnosis not present

## 2023-09-20 DIAGNOSIS — D123 Benign neoplasm of transverse colon: Secondary | ICD-10-CM | POA: Diagnosis present

## 2023-09-20 DIAGNOSIS — K644 Residual hemorrhoidal skin tags: Secondary | ICD-10-CM | POA: Insufficient documentation

## 2023-09-20 DIAGNOSIS — Z1211 Encounter for screening for malignant neoplasm of colon: Secondary | ICD-10-CM | POA: Diagnosis not present

## 2023-09-20 DIAGNOSIS — I4819 Other persistent atrial fibrillation: Secondary | ICD-10-CM | POA: Insufficient documentation

## 2023-09-20 DIAGNOSIS — K5730 Diverticulosis of large intestine without perforation or abscess without bleeding: Secondary | ICD-10-CM | POA: Insufficient documentation

## 2023-09-20 DIAGNOSIS — Z860100 Personal history of colon polyps, unspecified: Secondary | ICD-10-CM

## 2023-09-20 DIAGNOSIS — Z860101 Personal history of adenomatous and serrated colon polyps: Secondary | ICD-10-CM

## 2023-09-20 HISTORY — PX: SUBMUCOSAL LIFTING INJECTION: SHX6855

## 2023-09-20 HISTORY — PX: COLONOSCOPY WITH PROPOFOL: SHX5780

## 2023-09-20 HISTORY — PX: HEMOSTASIS CLIP PLACEMENT: SHX6857

## 2023-09-20 HISTORY — PX: ENDOSCOPIC MUCOSAL RESECTION: SHX6839

## 2023-09-20 HISTORY — PX: POLYPECTOMY: SHX5525

## 2023-09-20 SURGERY — COLONOSCOPY WITH PROPOFOL
Anesthesia: Monitor Anesthesia Care

## 2023-09-20 MED ORDER — PROPOFOL 10 MG/ML IV BOLUS
INTRAVENOUS | Status: AC
  Filled 2023-09-20: qty 20

## 2023-09-20 MED ORDER — SODIUM CHLORIDE 0.9 % IV SOLN: INTRAVENOUS | Status: DC | PRN

## 2023-09-20 MED ORDER — PROPOFOL 500 MG/50ML IV EMUL
INTRAVENOUS | Status: DC | PRN
  Administered 2023-09-20: 80 mg via INTRAVENOUS
  Administered 2023-09-20: 50 mg via INTRAVENOUS
  Administered 2023-09-20: 70 mg via INTRAVENOUS
  Administered 2023-09-20: 40 mg via INTRAVENOUS
  Administered 2023-09-20: 30 mg via INTRAVENOUS
  Administered 2023-09-20: 40 mg via INTRAVENOUS
  Administered 2023-09-20: 20 mg via INTRAVENOUS
  Administered 2023-09-20: 130 mg via INTRAVENOUS
  Administered 2023-09-20: 40 mg via INTRAVENOUS
  Administered 2023-09-20 (×2): 30 mg via INTRAVENOUS
  Administered 2023-09-20: 60 mg via INTRAVENOUS
  Administered 2023-09-20: 30 mg via INTRAVENOUS

## 2023-09-20 MED ORDER — LIDOCAINE HCL (CARDIAC) PF 100 MG/5ML IV SOSY
PREFILLED_SYRINGE | INTRAVENOUS | Status: DC | PRN
  Administered 2023-09-20: 40 mg via INTRATRACHEAL

## 2023-09-20 MED ORDER — PROPOFOL 10 MG/ML IV BOLUS
INTRAVENOUS | Status: AC
Start: 2023-09-20 — End: ?
  Filled 2023-09-20: qty 20

## 2023-09-20 SURGICAL SUPPLY — 20 items

## 2023-09-20 NOTE — Transfer of Care (Signed)
 Immediate Anesthesia Transfer of Care Note  Patient: Joann Murray  Procedure(s) Performed: COLONOSCOPY WITH PROPOFOL  ENDOSCOPIC MUCOSAL RESECTION POLYPECTOMY HEMOSTASIS CONTROL HEMOSTASIS CLIP PLACEMENT SUBMUCOSAL LIFTING INJECTION  Patient Location: PACU and Endoscopy Unit  Anesthesia Type:MAC  Level of Consciousness: sedated  Airway & Oxygen Therapy: Patient Spontanous Breathing and Patient connected to face mask oxygen  Post-op Assessment: Report given to RN and Post -op Vital signs reviewed and stable  Post vital signs: Reviewed and stable  Last Vitals:  Vitals Value Taken Time  BP 98/61 09/20/23 0852  Temp 36.4 C 09/20/23 0852  Pulse 58 09/20/23 0855  Resp 15 09/20/23 0855  SpO2 98 % 09/20/23 0855  Vitals shown include unfiled device data.  Last Pain:  Vitals:   09/20/23 0852  TempSrc: Temporal  PainSc: Asleep         Complications: No notable events documented.

## 2023-09-20 NOTE — H&P (Signed)
 GASTROENTEROLOGY PROCEDURE H&P NOTE   Primary Care Physician: Patient, No Pcp Per  HPI: Joann Murray is a 63 y.o. female who presents for Colonoscopy for evaluation and attempt at EMR of TC SSP.  Past Medical History:  Diagnosis Date   History of CT scan of chest    Cardiac CTA 10/16:  Normal coronary arteries, Ca score 0   Murmur    normal LVF with G2DD on echo 2016   Osteoarthritis    Osteopenia    PAF (paroxysmal atrial fibrillation) (HCC)    PAT (paroxysmal atrial tachycardia) (HCC)    Persistent atrial fibrillation (HCC)    CHADS2VASC score of 1   Premature atrial contractions    PVC's (premature ventricular contractions)    Scoliosis    Past Surgical History:  Procedure Laterality Date   COLONOSCOPY     TOE SURGERY     No current facility-administered medications for this encounter.   No current facility-administered medications for this encounter. Allergies  Allergen Reactions   Codeine Itching   Family History  Problem Relation Age of Onset   Colon polyps Mother    Rheumatic fever Mother    Arthritis/Rheumatoid Mother    Stroke Mother    Alzheimer's disease Mother    Diverticulitis Mother    Esophageal cancer Neg Hx    Colon cancer Neg Hx    Pancreatic cancer Neg Hx    Stomach cancer Neg Hx    Diabetes Neg Hx    Heart disease Neg Hx    Liver disease Neg Hx    Rectal cancer Neg Hx    Social History   Socioeconomic History   Marital status: Legally Separated    Spouse name: fredrick   Number of children: 3   Years of education: college   Highest education level: Not on file  Occupational History   Occupation: agricultural consultant  Tobacco Use   Smoking status: Former    Types: Cigarettes   Smokeless tobacco: Never  Vaping Use   Vaping status: Never Used  Substance and Sexual Activity   Alcohol use: Yes    Alcohol/week: 7.0 standard drinks of alcohol    Types: 7 Glasses of wine per week    Comment: 1 glass of wine per day   Drug use: No    Sexual activity: Not on file  Other Topics Concern   Not on file  Social History Narrative   Not on file   Social Drivers of Health   Financial Resource Strain: Not on file  Food Insecurity: Not on file  Transportation Needs: Not on file  Physical Activity: Not on file  Stress: Not on file  Social Connections: Not on file  Intimate Partner Violence: Not on file    Physical Exam: Today's Vitals   09/17/23 1034 09/20/23 0718  BP:  (!) 144/85  Pulse:  80  Resp:  20  Temp:  98.4 F (36.9 C)  TempSrc:  Temporal  SpO2:  98%  Weight: 65 kg 63 kg  Height:  5' 10 (1.778 m)  PainSc:  0-No pain   Body mass index is 19.94 kg/m. GEN: NAD EYE: Sclerae anicteric ENT: MMM CV: Non-tachycardic GI: Soft, NT/ND NEURO:  Alert & Oriented x 3  Lab Results: No results for input(s): WBC, HGB, HCT, PLT in the last 72 hours. BMET No results for input(s): NA, K, CL, CO2, GLUCOSE, BUN, CREATININE, CALCIUM in the last 72 hours. LFT No results for input(s): PROT, ALBUMIN, AST, ALT, ALKPHOS, BILITOT,  BILIDIR, IBILI in the last 72 hours. PT/INR No results for input(s): LABPROT, INR in the last 72 hours.   Impression / Plan: This is a 63 y.o.female who presents for Colonoscopy for evaluation and attempt at EMR of TC SSP.  The risks and benefits of endoscopic evaluation/treatment were discussed with the patient and/or family; these include but are not limited to the risk of perforation, infection, bleeding, missed lesions, lack of diagnosis, severe illness requiring hospitalization, as well as anesthesia and sedation related illnesses.  The patient's history has been reviewed, patient examined, no change in status, and deemed stable for procedure.  The patient and/or family is agreeable to proceed.    Aloha Finner, MD [facility] Advanced Endoscopy Office # [phone]

## 2023-09-20 NOTE — Discharge Instructions (Signed)

## 2023-09-20 NOTE — Op Note (Signed)
 [facility] Patient Name: Joann Murray Procedure Date: 09/20/2023 MRN: [id number] Attending MD: Aloha Finner , MD, [id number] Date of Birth: 1961-06-14 CSN: [id number] Age: 63 Admit Type: Outpatient Procedure:                Colonoscopy Indications:              Excision of colonic polyp Providers:                Aloha Finner, MD, Burnard Fire RN, RN,                            Haskel Chris, Technician, Felice Sar, Technician Referring MD:             Jerrell KYM Sol, MD Medicines:                Monitored Anesthesia Care Complications:            No immediate complications. Estimated Blood Loss:     Estimated blood loss was minimal. Procedure:                Pre-Anesthesia Assessment:                           - Prior to the procedure, a History and Physical                            was performed, and patient medications and                            allergies were reviewed. The patient's tolerance of                            previous anesthesia was also reviewed. The risks                            and benefits of the procedure and the sedation                            options and risks were discussed with the patient.                            All questions were answered, and informed consent                            was obtained. Prior Anticoagulants: The patient has                            taken no anticoagulant or antiplatelet agents. ASA                            Grade Assessment: II - A patient with mild systemic                            disease. After reviewing the risks and benefits,  the patient was deemed in satisfactory condition to                            undergo the procedure.                           After obtaining informed consent, the colonoscope                            was passed under direct vision. Throughout the                            procedure, the patient's blood pressure,  pulse, and                            oxygen saturations were monitored continuously. The                            [id number]-HQ190L ([id number] Olympus colonoscope was                            introduced through the anus and advanced to the the                            cecum, identified by appendiceal orifice and                            ileocecal valve. The colonoscopy was performed                            without difficulty. The patient tolerated the                            procedure. The quality of the bowel preparation was                            good. The ileocecal valve, appendiceal orifice, and                            rectum were photographed. Scope In: 7:54:13 AM Scope Out: 8:44:35 AM Scope Withdrawal Time: 0 hours 44 minutes 41 seconds  Total Procedure Duration: 0 hours 50 minutes 22 seconds  Findings:      The digital rectal exam findings include hemorrhoids. Pertinent       negatives include no palpable rectal lesions.      Multiple small-mouthed diverticula were found in the ascending colon.      A 35 mm polyp was found in the transverse colon. The polyp was sessile.       Unfortunately, there was evidence of tattoo underneath the polyp.       Preparations were made for mucosal resection. Demarcation of the lesion       was performed with high-definition white light and narrow band imaging       to clearly identify the boundaries of the lesion. Endoclot was injected       to raise the lesion. Piecemeal  mucosal resection using a snare was       performed. Resection and retrieval were complete. Resected tissue       margins were examined and clear of polyp tissue. Fulguration to ablate       the lesion margin by snare tip soft coagulation was successful. To       prevent bleeding after mucosal resection, five hemostatic clips were       successfully placed (MR conditional). Clip manufacturer: [employer]. There was no bleeding during, or at the end, of  the       procedure.      Four sessile polyps were found in the recto-sigmoid colon, sigmoid       colon, transverse colon and ascending colon. The polyps were 3 to 13 mm       in size. These polyps were removed with a cold snare. Resection and       retrieval were complete.      Non-bleeding non-thrombosed external and internal hemorrhoids were found       during retroflexion, during perianal exam and during digital exam. The       hemorrhoids were Grade II (internal hemorrhoids that prolapse but reduce       spontaneously). Impression:               - Hemorrhoids found on digital rectal exam.                           - Diverticulosis in the ascending colon.                           - One 35 mm polyp in the transverse colon, removed                            with mucosal resection. Resected and retrieved.                            Treated with STSC to margin. Clips (MR conditional)                            were placed. Clip manufacturer: [employer].                           - Four 3 to 13 mm polyps at the recto-sigmoid                            colon, in the sigmoid colon, in the transverse                            colon and in the ascending colon, removed with a                            cold snare. Resected and retrieved.                           - Non-bleeding non-thrombosed external and internal  hemorrhoids. Moderate Sedation:      Not Applicable - Patient had care per Anesthesia. Recommendation:           - The patient will be observed post-procedure,                            until all discharge criteria are met.                           - Discharge patient to home.                           - Patient has a contact number available for                            emergencies. The signs and symptoms of potential                            delayed complications were discussed with the                            patient. Return to  normal activities tomorrow.                            Written discharge instructions were provided to the                            patient.                           - Continue present medications.                           - Await pathology results.                           - Repeat colonoscopy in 6 to 9 months for                            surveillance after piecemeal polypectomy.                           - The findings and recommendations were discussed                            with the patient.                           - The findings and recommendations were discussed                            with the patient's family. Procedure Code(s):        --- Professional ---                           [phone], Colonoscopy, flexible; with endoscopic  mucosal resection                           [id number], 59, Colonoscopy, flexible; with removal of                            tumor(s), polyp(s), or other lesion(s) by snare                            technique Diagnosis Code(s):        --- Professional ---                           K64.1, Second degree hemorrhoids                           D12.7, Benign neoplasm of rectosigmoid junction                           D12.5, Benign neoplasm of sigmoid colon                           D12.3, Benign neoplasm of transverse colon (hepatic                            flexure or splenic flexure)                           D12.2, Benign neoplasm of ascending colon                           K63.5, Polyp of colon                           K57.30, Diverticulosis of large intestine without                            perforation or abscess without bleeding CPT copyright 2022 American Medical Association. All rights reserved. The codes documented in this report are preliminary and upon coder review may  be revised to meet current compliance requirements. Aloha Finner, MD 09/20/2023 9:10:10 AM Number of Addenda: 0

## 2023-09-20 NOTE — Anesthesia Preprocedure Evaluation (Addendum)
 Anesthesia Evaluation  Patient identified by MRN, date of birth, ID band Patient awake    Reviewed: Allergy & Precautions, NPO status , Patient's Chart, lab work & pertinent test results  Airway Mallampati: II  TM Distance: >3 FB Neck ROM: Full    Dental no notable dental hx.    Pulmonary former smoker   Pulmonary exam normal        Cardiovascular Normal cardiovascular exam+ dysrhythmias Atrial Fibrillation + Valvular Problems/Murmurs      Neuro/Psych negative neurological ROS  negative psych ROS   GI/Hepatic negative GI ROS,,,(+)     substance abuse    Endo/Other  negative endocrine ROS    Renal/GU negative Renal ROS     Musculoskeletal negative musculoskeletal ROS (+)    Abdominal   Peds  Hematology negative hematology ROS (+)   Anesthesia Other Findings Hx of Colon Polyps  Reproductive/Obstetrics                             Anesthesia Physical Anesthesia Plan  ASA: 2  Anesthesia Plan: MAC   Post-op Pain Management:    Induction:   PONV Risk Score and Plan: 2 and Propofol  infusion and Treatment may vary due to age or medical condition  Airway Management Planned: Simple Face Mask  Additional Equipment:   Intra-op Plan:   Post-operative Plan:   Informed Consent: I have reviewed the patients History and Physical, chart, labs and discussed the procedure including the risks, benefits and alternatives for the proposed anesthesia with the patient or authorized representative who has indicated his/her understanding and acceptance.       Plan Discussed with: CRNA  Anesthesia Plan Comments:        Anesthesia Quick Evaluation

## 2023-09-21 LAB — SURGICAL PATHOLOGY

## 2023-09-21 NOTE — Anesthesia Postprocedure Evaluation (Signed)
 Anesthesia Post Note  Patient: Joann Murray  Procedure(s) Performed: COLONOSCOPY WITH PROPOFOL  ENDOSCOPIC MUCOSAL RESECTION POLYPECTOMY HEMOSTASIS CONTROL HEMOSTASIS CLIP PLACEMENT SUBMUCOSAL LIFTING INJECTION     Patient location during evaluation: Endoscopy Anesthesia Type: MAC Level of consciousness: awake Pain management: pain level controlled Vital Signs Assessment: post-procedure vital signs reviewed and stable Respiratory status: spontaneous breathing, nonlabored ventilation and respiratory function stable Cardiovascular status: blood pressure returned to baseline and stable Postop Assessment: no apparent nausea or vomiting Anesthetic complications: no   No notable events documented.  Last Vitals:  Vitals:   09/20/23 0900 09/20/23 0910  BP: 105/75 119/83  Pulse: 86 (!) 58  Resp: 20 19  Temp:    SpO2: 96% 99%    Last Pain:  Vitals:   09/20/23 0910  TempSrc:   PainSc: 0-No pain                 Sruti Ayllon P Jerauld Bostwick

## 2023-09-22 ENCOUNTER — Encounter: Payer: Self-pay | Admitting: Gastroenterology

## 2023-09-23 ENCOUNTER — Encounter (HOSPITAL_COMMUNITY): Payer: Self-pay | Admitting: Gastroenterology

## 2024-06-20 NOTE — Progress Notes (Signed)
 Subjective   Joann Murray is a 63 y.o. female who presents for an annual exam.  Recent Bone Density showed Osteoporosis. She was taking Vit D and calcium. Cut back on one VIt Supplement as she thought dose may be too high when added with other 2 supplements. She is doing weight bearing exercise. SHe is not a big eater and has been that way her whole life. SHe has scoliosis and sees Max Cohen in [place].   Arthritis, Tested for RA and negative in the past.  Nail bed lifting. Using SNS nails. Stopped this and trying to get them to grow out. Using manuka oil.   COlonoscopy Jan 2025. Needs 9 month follow up. WIll schedule as they have reached out to her.  Mammogram Benign Pap March 2024, negative [facility]  Health Maintenance: Last wellness visit:  over a year Diet:  general small portions Calcium supplementation:  continuously Vitamin D supplementation:  continuously Exercise frequency:  frequently Exercise type:  walking and weight training Pap: was normal Mammogram:  was normal DEXA:  Yes Colonoscopy:  Yes  Problem List[1] Medications Taking[2] Allergies Patient is allergic to codeine.  Review of Systems - All other systems were reviewed and are negative unless stated in HPI.  Family History  Problem Relation Age of Onset  . Hypertension Mother   . Heart disease Mother   . Alzheimer's disease Mother   . No Known Problems Father    History reviewed. No pertinent past medical history. History reviewed. No pertinent surgical history. Pediatric History  Patient Parents  . Not on file   Other Topics Concern  . Not on file  Social History Narrative  . Not on file    has no past surgical history on file.  Objective   BP 138/88 (BP Location: Left Upper Arm)   Pulse 72   Temp 98 F (36.7 C) (Temporal)   Resp 16   Ht 5' 10 (1.778 m)   Wt 143 lb (64.9 kg)   LMP  (LMP Unknown)   SpO2 97%   Breastfeeding No   BMI 20.52 kg/m   General: The patient is a 63 y.o.  female who appears to be in no acute distress. Psych: She is alert and oriented to person, place, and time. Her mood and affect are normal. HEENT: Normocephalic, atraumatic, non-icteric sclera, PERRL.  Tympanic membranes are without perforation or infection.  Nasopharynx is grossly normal.  Oropharynx is without mass or exudate. Neck:  Supple.  Trachea is in midline position.  The neck is without adenopathy, masses, or thyromegaly. Lungs:  Good breath sounds are noted bilaterally.  The lungs are clear to auscultation and percussion bilaterally.  The spine and CVA region are  nontender to palpation. Cardio:  Regular rate and rhythm without gallop, rub, or murmur.  Abdomen:  Bowel sounds are physiologic.  The abdomen is soft and nontender to palpation.  No masses are noted.  No hepatosplenomegaly is noted.  Skin:  No rashes or worrisome lesions are noted.  Extremities:  The extremities are without cyanosis or significant contusions.  Pitting edema is not noted.  ROM is normal in all four extremities. Pulses: Adequate pulses are noted in all four extremities and both carotid arteries. Neuro:  Mental status is normal.  CN 2-11 are grossly normal.  Motor and sensory exams are grossly normal.  DTR are physiologic in all extremities.  Gait is stable. Breast Exam:  Deferred Pelvic:  Deferred   Impression     ICD-10-CM  1. Well adult exam  Z00.00 CBC And Differential    CMP14 (Reflex HGB A1c)    TSH Reflex T4Free/T3 Hormone    TSH Reflex T4Free/T3 Hormone    CMP14 (Reflex HGB A1c)    CBC And Differential    2. Scoliosis, unspecified scoliosis type, unspecified spinal region  M41.9     3. Psoriasis  L40.9     4. Hx of adenomatous colonic polyps  Z86.0101     5. Osteoporosis, unspecified osteoporosis type, unspecified pathological fracture presence  M81.0 Vitamin D 25 Hydroxy    CMP14 (Reflex HGB A1c)    Ambulatory Referral to CPP Medication Management    Estradiol    Estradiol    CMP14  (Reflex HGB A1c)    Vitamin D 25 Hydroxy    6. Screening for lipid disorders  Z13.220 Lipid Panel With LDL/HDL Ratio    Lipid Panel With LDL/HDL Ratio      Plan    Orders Placed This Encounter  Procedures  . Vitamin D 25 Hydroxy  . CBC And Differential  . Lipid Panel With LDL/HDL Ratio  . CMP14 (Reflex HGB A1c)  . TSH Reflex T4Free/T3 Hormone  . Estradiol  . Ambulatory Referral to CPP Medication Management   - labs today - referral to clinical pharmacist to discuss osteoporosis treatment and management. She will also discuss this with her spine specialist Dr Gust. - Health maintenance issues including appropriate cancer screening, healthy diet, exercise and tobacco avoidance were discussed with the patient.  I've encouraged healthy lifestyle modifications of eating fruits/vegetables, decreased fat intake, regular daily exercise, and decrease stress.  - Risks, benefits, and alternatives of the medications and treatment plan prescribed today were discussed, and patient expressed understanding.  - Pap smear and breast exam are done at her ob/gyn.  - Labs ordered today: cbc/d,cmp,tsh,vitamin d,lipid panel.  We will call pt with results. - Follow up in about 1 year (around 06/20/2025) for physical with fasting labs. - Return to clinic to be reevaluated if symptoms worsen, persist, change, or if you have any other concerns. - I discussed this diagnosis with the patient and discussed the treatment plan with them. This treatment plan is also outlined in the Patient Instructions and a copy of this was provided to the patient. Patient  verbalized to me that they understood what their problem is, what they need to do about it, and why it is important that they do it.  The patient/family voices understanding of all medications. No barriers to adherence were noted. Patient is taking all medications as prescribed and is tolerating well.  Plan for follow-up as discussed or as needed if any worsening  symptoms or change in condition.          [1] Patient Active Problem List Diagnosis  . Adenomatous polyp of transverse colon  . Calculus of kidney  . Heart murmur  . High blood pressure  . History of neoplasm  . Hx of adenomatous colonic polyps  . Incomplete RBBB  . Psoriasis  . SK (seborrheic keratosis)  . Scoliosis  [2] No outpatient medications have been marked as taking for the 06/20/24 encounter (Office Visit) with Aldona LITTIE Pizza, NP.

## 2024-06-23 ENCOUNTER — Other Ambulatory Visit: Payer: Self-pay

## 2024-06-23 ENCOUNTER — Telehealth: Payer: Self-pay | Admitting: Gastroenterology

## 2024-06-23 DIAGNOSIS — Z860101 Personal history of adenomatous and serrated colon polyps: Secondary | ICD-10-CM

## 2024-06-23 DIAGNOSIS — D123 Benign neoplasm of transverse colon: Secondary | ICD-10-CM

## 2024-06-23 MED ORDER — NA SULFATE-K SULFATE-MG SULF 17.5-3.13-1.6 GM/177ML PO SOLN: 1.0000 | Freq: Once | ORAL | 0 refills | Status: AC

## 2024-06-23 NOTE — Telephone Encounter (Signed)
 Colon has been set up for 07/31/24 at 1 pm at [facility] with GM   Colon scheduled, pt instructed and medications reviewed.  Patient instructions mailed to home.  Patient to call with any questions or concerns.

## 2024-06-23 NOTE — Telephone Encounter (Signed)
 Inbound call from patient stating she would like to schedule her recall [facility] COLON Requesting a call back  Please advise Thank you

## 2024-06-23 NOTE — Telephone Encounter (Signed)
 Pt was scheduled in office on 10/9

## 2024-06-23 NOTE — Telephone Encounter (Addendum)
 Patient was not scheduled in office on 10/9. That was 06/23/23. I will get patient scheduled when I have a chance.

## 2024-06-23 NOTE — Telephone Encounter (Signed)
 Sorry Ro I overlooked that I will take care of it

## 2024-07-21 ENCOUNTER — Encounter: Payer: Self-pay | Admitting: Gastroenterology

## 2024-07-24 ENCOUNTER — Encounter (HOSPITAL_COMMUNITY): Payer: Self-pay | Admitting: Gastroenterology

## 2024-07-24 NOTE — Progress Notes (Signed)
 Attempted to obtain medical history for pre op call via telephone, unable to reach at this time. HIPAA compliant voicemail message left requesting return call to pre surgical testing department.

## 2024-07-25 ENCOUNTER — Telehealth: Payer: Self-pay

## 2024-07-25 NOTE — Telephone Encounter (Signed)
 Procedure:COLON Procedure date: 07/31/24 Procedure location: [facility] Arrival Time: 10:32 Spoke with the patient Y/N: Y Any prep concerns? N  Has the patient obtained the prep from the pharmacy ? Y Do you have a care partner and transportation: Y Any additional concerns? N

## 2024-07-31 ENCOUNTER — Ambulatory Visit (HOSPITAL_COMMUNITY): Admitting: Certified Registered Nurse Anesthetist

## 2024-07-31 ENCOUNTER — Ambulatory Visit (HOSPITAL_COMMUNITY)
Admission: RE | Admit: 2024-07-31 | Discharge: 2024-07-31 | Disposition: A | Discharge status: Home / Self Care | Attending: Gastroenterology | Admitting: Gastroenterology

## 2024-07-31 ENCOUNTER — Encounter (HOSPITAL_COMMUNITY): Payer: Self-pay | Admitting: Gastroenterology

## 2024-07-31 ENCOUNTER — Other Ambulatory Visit: Payer: Self-pay

## 2024-07-31 ENCOUNTER — Encounter (HOSPITAL_COMMUNITY)
Admission: RE | Disposition: A | Discharge status: Home / Self Care | Payer: Self-pay | Source: Home / Self Care | Attending: Gastroenterology

## 2024-07-31 DIAGNOSIS — K641 Second degree hemorrhoids: Secondary | ICD-10-CM

## 2024-07-31 DIAGNOSIS — Z1211 Encounter for screening for malignant neoplasm of colon: Secondary | ICD-10-CM

## 2024-07-31 DIAGNOSIS — K5730 Diverticulosis of large intestine without perforation or abscess without bleeding: Secondary | ICD-10-CM | POA: Diagnosis not present

## 2024-07-31 DIAGNOSIS — I4819 Other persistent atrial fibrillation: Secondary | ICD-10-CM | POA: Insufficient documentation

## 2024-07-31 DIAGNOSIS — D122 Benign neoplasm of ascending colon: Secondary | ICD-10-CM | POA: Diagnosis not present

## 2024-07-31 DIAGNOSIS — K635 Polyp of colon: Secondary | ICD-10-CM | POA: Insufficient documentation

## 2024-07-31 DIAGNOSIS — Z9889 Other specified postprocedural states: Secondary | ICD-10-CM

## 2024-07-31 DIAGNOSIS — I10 Essential (primary) hypertension: Secondary | ICD-10-CM

## 2024-07-31 DIAGNOSIS — Z860101 Personal history of adenomatous and serrated colon polyps: Secondary | ICD-10-CM

## 2024-07-31 DIAGNOSIS — Z87891 Personal history of nicotine dependence: Secondary | ICD-10-CM | POA: Insufficient documentation

## 2024-07-31 DIAGNOSIS — D123 Benign neoplasm of transverse colon: Secondary | ICD-10-CM | POA: Diagnosis not present

## 2024-07-31 HISTORY — PX: COLONOSCOPY: SHX5424

## 2024-07-31 SURGERY — COLONOSCOPY
Anesthesia: Monitor Anesthesia Care

## 2024-07-31 MED ORDER — SODIUM CHLORIDE 0.9 % IV SOLN: INTRAVENOUS | Status: DC

## 2024-07-31 MED ORDER — LIDOCAINE 2% (20 MG/ML) 5 ML SYRINGE
INTRAMUSCULAR | Status: DC | PRN
  Administered 2024-07-31: 60 mg via INTRAVENOUS

## 2024-07-31 MED ORDER — PROPOFOL 1000 MG/100ML IV EMUL
INTRAVENOUS | Status: AC
  Filled 2024-07-31: qty 100

## 2024-07-31 MED ORDER — PROPOFOL 10 MG/ML IV BOLUS
INTRAVENOUS | Status: DC | PRN
  Administered 2024-07-31: 20 mg via INTRAVENOUS

## 2024-07-31 MED ORDER — PROPOFOL 500 MG/50ML IV EMUL
INTRAVENOUS | Status: DC | PRN
Start: 2024-07-31 — End: 2024-07-31
  Administered 2024-07-31: 150 ug/kg/min via INTRAVENOUS

## 2024-07-31 NOTE — Op Note (Signed)
 [facility] Patient Name: Joann Murray Procedure Date: 07/31/2024 MRN: [id number] Attending MD: Aloha Finner , MD, [id number] Date of Birth: Apr 18, 1961 CSN: [id number] Age: 63 Admit Type: Outpatient Procedure:                Colonoscopy Indications:              High risk colon cancer surveillance: Personal                            history of sessile serrated colon polyp (10 mm or                            greater in size) Providers:                Aloha Finner, MD, Olam Riedel, RN, Corene Southgate,                            Technician Referring MD:              Medicines:                Monitored Anesthesia Care Complications:            No immediate complications. Estimated Blood Loss:     Estimated blood loss was minimal. Procedure:                Pre-Anesthesia Assessment:                           - Prior to the procedure, a History and Physical                            was performed, and patient medications and                            allergies were reviewed. The patient's tolerance of                            previous anesthesia was also reviewed. The risks                            and benefits of the procedure and the sedation                            options and risks were discussed with the patient.                            All questions were answered, and informed consent                            was obtained. Prior Anticoagulants: The patient has                            taken no anticoagulant or antiplatelet agents. ASA  Grade Assessment: II - A patient with mild systemic                            disease. After reviewing the risks and benefits,                            the patient was deemed in satisfactory condition to                            undergo the procedure.                           After obtaining informed consent, the colonoscope                            was passed under direct  vision. Throughout the                            procedure, the patient's blood pressure, pulse, and                            oxygen saturations were monitored continuously. The                            [id number]-[id number] ([id number]) Olympus colonscope was                            introduced through the anus and advanced to the the                            cecum, identified by appendiceal orifice and                            ileocecal valve. The colonoscopy was performed                            without difficulty. The patient tolerated the                            procedure. The quality of the bowel preparation was                            adequate. The terminal ileum, ileocecal valve,                            appendiceal orifice, and rectum were photographed. Scope In: 2:04:35 PM Scope Out: 2:28:17 PM Scope Withdrawal Time: 0 hours 17 minutes 23 seconds  Total Procedure Duration: 0 hours 23 minutes 42 seconds  Findings:      The digital rectal exam findings include hemorrhoids. Pertinent       negatives include no palpable rectal lesions.      A 2 mm polyp was found in the proximal ascending colon. The polyp was       sessile. The polyp was removed with a cold snare. Resection and  retrieval were complete.      A medium post mucosectomy scar was found in the transverse colon. The       area looks realtively health. There appeared on the proximal aspect a       small amount of possible recurrent polypoid tissue. This area was       removed with snare and biopsy for histology purposes.      Multiple small-mouthed diverticula were found in the entire colon.      Normal mucosa was found in the entire colon otherwise.      Non-bleeding non-thrombosed external and internal hemorrhoids were found       during retroflexion, during perianal exam and during digital exam. The       hemorrhoids were Grade II (internal hemorrhoids that prolapse but reduce        spontaneously). Impression:               - Hemorrhoids found on digital rectal exam.                           - One 2 mm polyp in the proximal ascending colon,                            removed with a cold snare. Resected and retrieved.                           - Post mucosectomy scar in the transverse colon.                            Small area of possible polypoid recurrence -                            removed.                           - Diverticulosis in the entire examined colon.                           - Normal mucosa in the entire examined colon                            otherwise.                           - Non-bleeding non-thrombosed external and internal                            hemorrhoids. Moderate Sedation:      Not Applicable - Patient had care per Anesthesia. Recommendation:           - The patient will be observed post-procedure,                            until all discharge criteria are met.                           - Discharge patient to home.                           -  Patient has a contact number available for                            emergencies. The signs and symptoms of potential                            delayed complications were discussed with the                            patient. Return to normal activities tomorrow.                            Written discharge instructions were provided to the                            patient.                           - High fiber diet.                           - Use FiberCon 1-2 tablets PO daily.                           - Continue present medications.                           - Await pathology results.                           - Repeat colonoscopy in 1 year for surveillance                            based on pathology results if dysplasia is found in                            the scar site biopsies otherwise if no recurrence                            we will push out to 3-years followup.                            - The findings and recommendations were discussed                            with the patient.                           - The findings and recommendations were discussed                            with the patient's family. Procedure Code(s):        --- Professional ---                           [phone], Colonoscopy, flexible;  with removal of                            tumor(s), polyp(s), or other lesion(s) by snare                            technique                           45380, 59, Colonoscopy, flexible; with biopsy,                            single or multiple Diagnosis Code(s):        --- Professional ---                           Z86.010, Personal history of colonic polyps                           D12.2, Benign neoplasm of ascending colon                           Z98.890, Other specified postprocedural states                           K64.1, Second degree hemorrhoids                           K57.30, Diverticulosis of large intestine without                            perforation or abscess without bleeding CPT copyright 2022 [employer]. All rights reserved. The codes documented in this report are preliminary and upon coder review may  be revised to meet current compliance requirements. Aloha Finner, MD 07/31/2024 2:51:33 PM Number of Addenda: 0

## 2024-07-31 NOTE — Discharge Instructions (Signed)

## 2024-07-31 NOTE — Transfer of Care (Signed)
 Immediate Anesthesia Transfer of Care Note  Patient: Joann Murray  Procedure(s) Performed: COLONOSCOPY  Patient Location: PACU and Endoscopy Unit  Anesthesia Type:MAC  Level of Consciousness: awake, alert , and oriented  Airway & Oxygen Therapy: Patient Spontanous Breathing and Patient connected to face mask oxygen  Post-op Assessment: Report given to RN and Post -op Vital signs reviewed and stable  Post vital signs: Reviewed and stable  Last Vitals:  Vitals Value Taken Time  BP    Temp    Pulse    Resp    SpO2      Last Pain:  Vitals:   07/31/24 1230  TempSrc: Temporal  PainSc: 0-No pain         Complications: No notable events documented.

## 2024-07-31 NOTE — H&P (Signed)
 GASTROENTEROLOGY PROCEDURE H&P NOTE   Primary Care Physician: Teresa Aldona CROME, NP  HPI: Joann Murray is a 63 y.o. female who presents for Colonoscopy for surveillance of previously resected SSP.  Past Medical History:  Diagnosis Date   History of CT scan of chest    Cardiac CTA 10/16:  Normal coronary arteries, Ca score 0   Murmur    normal LVF with G2DD on echo 2016   Osteoarthritis    Osteopenia    PAF (paroxysmal atrial fibrillation) (HCC)    PAT (paroxysmal atrial tachycardia)    Persistent atrial fibrillation (HCC)    CHADS2VASC score of 1   Premature atrial contractions    PVC's (premature ventricular contractions)    Scoliosis    Past Surgical History:  Procedure Laterality Date   COLONOSCOPY     COLONOSCOPY WITH PROPOFOL  N/A 09/20/2023   Procedure: COLONOSCOPY WITH PROPOFOL ;  Surgeon: Mansouraty, Aloha Raddle., MD;  Location: [facility];  Service: Gastroenterology;  Laterality: N/A;   ENDOSCOPIC MUCOSAL RESECTION N/A 09/20/2023   Procedure: ENDOSCOPIC MUCOSAL RESECTION;  Surgeon: Wilhelmenia Aloha Raddle., MD;  Location: [facility];  Service: Gastroenterology;  Laterality: N/A;   HEMOSTASIS CLIP PLACEMENT  09/20/2023   Procedure: HEMOSTASIS CLIP PLACEMENT;  Surgeon: Wilhelmenia Aloha Raddle., MD;  Location: [facility];  Service: Gastroenterology;;   POLYPECTOMY  09/20/2023   Procedure: POLYPECTOMY;  Surgeon: Wilhelmenia Aloha Raddle., MD;  Location: [facility];  Service: Gastroenterology;;   SUBMUCOSAL LIFTING INJECTION  09/20/2023   Procedure: SUBMUCOSAL LIFTING INJECTION;  Surgeon: Wilhelmenia Aloha Raddle., MD;  Location: [facility];  Service: Gastroenterology;;   TOE SURGERY     Current Facility-Administered Medications  Medication Dose Route Frequency Provider Last Rate Last Admin   0.9 %  sodium chloride  infusion   Intravenous Continuous Mansouraty, Aloha Raddle., MD 20 mL/hr at 07/31/24 1228 New Bag at 07/31/24 1228    Current Facility-Administered Medications:     0.9 %  sodium chloride  infusion, , Intravenous, Continuous, Mansouraty, Aloha Raddle., MD, Last Rate: 20 mL/hr at 07/31/24 1228, New Bag at 07/31/24 1228 Allergies  Allergen Reactions   Codeine Itching   Family History  Problem Relation Age of Onset   Colon polyps Mother    Rheumatic fever Mother    Arthritis/Rheumatoid Mother    Stroke Mother    Alzheimer's disease Mother    Diverticulitis Mother    Esophageal cancer Neg Hx    Colon cancer Neg Hx    Pancreatic cancer Neg Hx    Stomach cancer Neg Hx    Diabetes Neg Hx    Heart disease Neg Hx    Liver disease Neg Hx    Rectal cancer Neg Hx    Social History   Socioeconomic History   Marital status: Legally Separated    Spouse name: fredrick   Number of children: 3   Years of education: college   Highest education level: Not on file  Occupational History   Occupation: volunteer  Tobacco Use   Smoking status: Former    Types: Cigarettes   Smokeless tobacco: Never  Vaping Use   Vaping status: Never Used  Substance and Sexual Activity   Alcohol use: Yes    Alcohol/week: 7.0 standard drinks of alcohol    Types: 7 Glasses of wine per week    Comment: 1 glass of wine per day   Drug use: No   Sexual activity: Not on file  Other Topics Concern   Not on file  Social History Narrative  Not on file   Social Drivers of Health   Financial Resource Strain: Low Risk  (06/18/2024)   Received from [facility]   Overall Financial Resource Strain (CARDIA)    How hard is it for you to pay for the very basics like food, housing, medical care, and heating?: Not hard at all  Food Insecurity: No Food Insecurity (06/18/2024)   Received from [facility]   Hunger Vital Sign    Within the past 12 months, you worried that your food would run out before you got the money to buy more.: Never true    Within the past 12 months, the food you bought just didn't last and you didn't have money to get more.: Never true  Transportation Needs: No  Transportation Needs (06/18/2024)   Received from [facility] - Transportation    In the past 12 months, has lack of transportation kept you from medical appointments or from getting medications?: No    In the past 12 months, has lack of transportation kept you from meetings, work, or from getting things needed for daily living?: No  Physical Activity: Insufficiently Active (06/18/2024)   Received from [facility]   Exercise Vital Sign    On average, how many days per week do you engage in moderate to strenuous exercise (like a brisk walk)?: 3 days    On average, how many minutes do you engage in exercise at this level?: 30 min  Stress: No Stress Concern Present (06/18/2024)   Received from [facility] - Occupational Stress Questionnaire    Do you feel stress - tense, restless, nervous, or anxious, or unable to sleep at night because your mind is troubled all the time - these days?: Not at all  Social Connections: Socially Integrated (06/18/2024)   Received from [facility]   Social Network    How would you rate your social network (family, work, friends)?: Good participation with social networks  Intimate Partner Violence: Patient Declined (06/18/2024)   Received from [facility]   HITS    Over the last 12 months how often did your partner physically hurt you?: Patient declined    Over the last 12 months how often did your partner insult you or talk down to you?: Patient declined    Over the last 12 months how often did your partner threaten you with physical harm?: Patient declined    Over the last 12 months how often did your partner scream or curse at you?: Patient declined    Physical Exam: There were no vitals filed for this visit. There is no height or weight on file to calculate BMI. GEN: NAD EYE: Sclerae anicteric ENT: MMM CV: Non-tachycardic GI: Soft, NT/ND NEURO:  Alert & Oriented x 3  Lab Results: No results  for input(s): WBC, HGB, HCT, PLT in the last 72 hours. BMET No results for input(s): NA, K, CL, CO2, GLUCOSE, BUN, CREATININE, CALCIUM in the last 72 hours. LFT No results for input(s): PROT, ALBUMIN, AST, ALT, ALKPHOS, BILITOT, BILIDIR, IBILI in the last 72 hours. PT/INR No results for input(s): LABPROT, INR in the last 72 hours.   Impression / Plan: This is a 63 y.o.female who presents for Colonoscopy for surveillance of previously resected SSP.  The risks and benefits of endoscopic evaluation/treatment were discussed with the patient and/or family; these include but are not limited to the risk of perforation, infection, bleeding, missed lesions, lack of diagnosis,  severe illness requiring hospitalization, as well as anesthesia and sedation related illnesses.  The patient's history has been reviewed, patient examined, no change in status, and deemed stable for procedure.  The patient and/or family was provided an opportunity to ask questions and all were answered.  The patient and/or family is agreeable to proceed.    Aloha Finner, MD [facility] Office # [phone]

## 2024-07-31 NOTE — Anesthesia Procedure Notes (Signed)
 Procedure Name: MAC Date/Time: 07/31/2024 1:46 PM  Performed by: Zulema Leita PARAS, CRNAPre-anesthesia Checklist: Patient identified, Emergency Drugs available, Suction available and Patient being monitored Oxygen Delivery Method: Simple face mask

## 2024-07-31 NOTE — Anesthesia Preprocedure Evaluation (Signed)
 Anesthesia Evaluation  Patient identified by MRN, date of birth, ID band Patient awake    Reviewed: Allergy & Precautions, H&P , NPO status , Patient's Chart, lab work & pertinent test results  Airway Mallampati: II   Neck ROM: full    Dental   Pulmonary former smoker   breath sounds clear to auscultation       Cardiovascular hypertension, + dysrhythmias Atrial Fibrillation  Rhythm:irregular Rate:Normal     Neuro/Psych    GI/Hepatic   Endo/Other    Renal/GU      Musculoskeletal  (+) Arthritis ,    Abdominal   Peds  Hematology   Anesthesia Other Findings   Reproductive/Obstetrics                              Anesthesia Physical Anesthesia Plan  ASA: 3  Anesthesia Plan: MAC   Post-op Pain Management:    Induction: Intravenous  PONV Risk Score and Plan: 2 and Propofol  infusion and Treatment may vary due to age or medical condition  Airway Management Planned: Nasal Cannula  Additional Equipment:   Intra-op Plan:   Post-operative Plan:   Informed Consent: I have reviewed the patients History and Physical, chart, labs and discussed the procedure including the risks, benefits and alternatives for the proposed anesthesia with the patient or authorized representative who has indicated his/her understanding and acceptance.     Dental advisory given  Plan Discussed with: CRNA, Anesthesiologist and Surgeon  Anesthesia Plan Comments:         Anesthesia Quick Evaluation

## 2024-08-01 NOTE — Anesthesia Postprocedure Evaluation (Signed)
 Anesthesia Post Note  Patient: Joann Murray  Procedure(s) Performed: COLONOSCOPY     Patient location during evaluation: Endoscopy Anesthesia Type: MAC Level of consciousness: awake and alert Pain management: pain level controlled Vital Signs Assessment: post-procedure vital signs reviewed and stable Respiratory status: spontaneous breathing, nonlabored ventilation, respiratory function stable and patient connected to nasal cannula oxygen Cardiovascular status: stable and blood pressure returned to baseline Postop Assessment: no apparent nausea or vomiting Anesthetic complications: no   No notable events documented.  Last Vitals:  Vitals:   07/31/24 1450 07/31/24 1459  BP: 122/81 138/85  Pulse: 71 69  Resp: (!) 24 17  Temp:    SpO2: 97% 97%    Last Pain:  Vitals:   07/31/24 1459  TempSrc:   PainSc: 0-No pain                 Neasia Fleeman S

## 2024-08-02 ENCOUNTER — Encounter (HOSPITAL_COMMUNITY): Payer: Self-pay | Admitting: Gastroenterology

## 2024-08-03 LAB — SURGICAL PATHOLOGY

## 2024-08-04 ENCOUNTER — Ambulatory Visit: Payer: Self-pay | Admitting: Gastroenterology
